# Patient Record
Sex: Male | Born: 1950 | Hispanic: Refuse to answer | Smoking: Never smoker
Health system: Southern US, Community
[De-identification: ages and names within clinical notes are randomized; demographics above are authoritative.]

## PROBLEM LIST (undated history)

## (undated) DIAGNOSIS — I2510 Atherosclerotic heart disease of native coronary artery without angina pectoris: Secondary | ICD-10-CM

## (undated) DIAGNOSIS — N200 Calculus of kidney: Secondary | ICD-10-CM

## (undated) DIAGNOSIS — E215 Disorder of parathyroid gland, unspecified: Secondary | ICD-10-CM

## (undated) DIAGNOSIS — R55 Syncope and collapse: Secondary | ICD-10-CM

## (undated) HISTORY — DX: Atherosclerotic heart disease of native coronary artery without angina pectoris: I25.10

## (undated) HISTORY — DX: Hypercalcemia: E83.52

## (undated) HISTORY — DX: Syncope and collapse: R55

## (undated) HISTORY — DX: Disorder of parathyroid gland, unspecified: E21.5

---

## 2002-06-22 ENCOUNTER — Emergency Department (HOSPITAL_COMMUNITY): Admission: EM | Admit: 2002-06-22 | Discharge: 2002-06-23 | Payer: Self-pay | Admitting: Emergency Medicine

## 2002-06-23 ENCOUNTER — Encounter: Payer: Self-pay | Admitting: Emergency Medicine

## 2002-12-03 ENCOUNTER — Ambulatory Visit (HOSPITAL_COMMUNITY): Admission: RE | Admit: 2002-12-03 | Discharge: 2002-12-03 | Payer: Self-pay | Admitting: *Deleted

## 2006-05-12 ENCOUNTER — Emergency Department (HOSPITAL_COMMUNITY): Admission: EM | Admit: 2006-05-12 | Discharge: 2006-05-12 | Payer: Self-pay | Admitting: *Deleted

## 2009-02-14 ENCOUNTER — Emergency Department (HOSPITAL_COMMUNITY): Admission: EM | Admit: 2009-02-14 | Discharge: 2009-02-14 | Payer: Self-pay | Admitting: Emergency Medicine

## 2010-07-13 LAB — URINALYSIS, ROUTINE W REFLEX MICROSCOPIC
Bilirubin Urine: NEGATIVE
Glucose, UA: NEGATIVE mg/dL
Ketones, ur: NEGATIVE mg/dL
Nitrite: NEGATIVE
Protein, ur: NEGATIVE mg/dL
Specific Gravity, Urine: 1.024 (ref 1.005–1.030)
Urobilinogen, UA: 1 mg/dL (ref 0.0–1.0)
pH: 6 (ref 5.0–8.0)

## 2010-07-13 LAB — URINE MICROSCOPIC-ADD ON

## 2015-05-08 ENCOUNTER — Emergency Department (HOSPITAL_COMMUNITY)
Admission: EM | Admit: 2015-05-08 | Discharge: 2015-05-08 | Disposition: A | Discharge status: Home / Self Care | Payer: 59 | Attending: Emergency Medicine | Admitting: Emergency Medicine

## 2015-05-08 ENCOUNTER — Encounter (HOSPITAL_COMMUNITY): Payer: Self-pay | Admitting: *Deleted

## 2015-05-08 ENCOUNTER — Emergency Department (HOSPITAL_COMMUNITY): Payer: 59

## 2015-05-08 DIAGNOSIS — N200 Calculus of kidney: Secondary | ICD-10-CM | POA: Insufficient documentation

## 2015-05-08 DIAGNOSIS — R109 Unspecified abdominal pain: Secondary | ICD-10-CM | POA: Diagnosis present

## 2015-05-08 HISTORY — DX: Calculus of kidney: N20.0

## 2015-05-08 LAB — BASIC METABOLIC PANEL
ANION GAP: 11 (ref 5–15)
BUN: 20 mg/dL (ref 6–20)
CHLORIDE: 103 mmol/L (ref 101–111)
CO2: 25 mmol/L (ref 22–32)
Calcium: 10 mg/dL (ref 8.9–10.3)
Creatinine, Ser: 1.25 mg/dL — ABNORMAL HIGH (ref 0.61–1.24)
GFR calc Af Amer: >60 mL/min (ref >60)
GLUCOSE: 151 mg/dL — AB (ref 65–99)
POTASSIUM: 4.5 mmol/L (ref 3.5–5.1)
SODIUM: 139 mmol/L (ref 135–145)

## 2015-05-08 LAB — CBC
HCT: 47.7 % (ref 39.0–52.0)
HEMOGLOBIN: 16 g/dL (ref 13.0–17.0)
MCH: 30.7 pg (ref 26.0–34.0)
MCHC: 33.5 g/dL (ref 30.0–36.0)
MCV: 91.4 fL (ref 78.0–100.0)
PLATELETS: 274 10*3/uL (ref 150–400)
RBC: 5.22 MIL/uL (ref 4.22–5.81)
RDW: 13.1 % (ref 11.5–15.5)
WBC: 14.4 10*3/uL — AB (ref 4.0–10.5)

## 2015-05-08 LAB — URINALYSIS, ROUTINE W REFLEX MICROSCOPIC
BILIRUBIN URINE: NEGATIVE
Glucose, UA: NEGATIVE mg/dL
Ketones, ur: 15 mg/dL — AB
LEUKOCYTES UA: NEGATIVE
NITRITE: NEGATIVE
Protein, ur: 100 mg/dL — AB
SPECIFIC GRAVITY, URINE: 1.022 (ref 1.005–1.030)
pH: 5 (ref 5.0–8.0)

## 2015-05-08 LAB — URINE MICROSCOPIC-ADD ON

## 2015-05-08 MED ORDER — FENTANYL CITRATE (PF) 100 MCG/2ML IJ SOLN
50.0000 ug | Freq: Once | INTRAMUSCULAR | Status: AC
  Administered 2015-05-08: 50 ug via INTRAVENOUS
  Filled 2015-05-08: qty 2

## 2015-05-08 MED ORDER — SODIUM CHLORIDE 0.9 % IV BOLUS (SEPSIS)
1000.0000 mL | Freq: Once | INTRAVENOUS | Status: AC
  Administered 2015-05-08: 1000 mL via INTRAVENOUS

## 2015-05-08 MED ORDER — KETOROLAC TROMETHAMINE 30 MG/ML IJ SOLN
30.0000 mg | Freq: Once | INTRAMUSCULAR | Status: AC
Start: 2015-05-08 — End: 2015-05-08
  Administered 2015-05-08: 30 mg via INTRAVENOUS
  Filled 2015-05-08: qty 1

## 2015-05-08 MED ORDER — ONDANSETRON 4 MG PO TBDP: 4.0000 mg | ORAL_TABLET | Freq: Three times a day (TID) | ORAL | Status: DC | PRN

## 2015-05-08 MED ORDER — OXYCODONE-ACETAMINOPHEN 5-325 MG PO TABS: 1.0000 | ORAL_TABLET | Freq: Four times a day (QID) | ORAL | Status: DC | PRN

## 2015-05-08 MED ORDER — TAMSULOSIN HCL 0.4 MG PO CAPS: 0.4000 mg | ORAL_CAPSULE | Freq: Every day | ORAL | Status: DC

## 2015-05-08 NOTE — ED Provider Notes (Signed)
CSN: [id number]     Arrival date & time 05/08/15  1037 History   First MD Initiated Contact with Patient 05/08/15 1058     Chief Complaint  Patient presents with  . Flank Pain   (Consider location/radiation/quality/duration/timing/severity/associated sxs/prior Treatment) HPI 65 y.o. male with a hx of kidney stones, presents to the Emergency Department today complaining of R flank pain since this morning. Has associated N/V with no hematemesis. Describes the pain as sharp 10/10 in right flank. Notes pain at rest and with movement. No fevers. No CP/SOB/ABD pain. Has not taken anything for relief. No other symptoms noted.    Past Medical History  Diagnosis Date  . Kidney stones    History reviewed. No pertinent past surgical history. No family history on file. Social History  Substance Use Topics  . Smoking status: Never Smoker   . Smokeless tobacco: None  . Alcohol Use: No    Review of Systems ROS reviewed and all are negative for acute change except as noted in the HPI.  Allergies  Review of patient's allergies indicates no known allergies.  Home Medications   Prior to Admission medications   Not on File   BP 160/83 mmHg  Pulse 49  Temp(Src) 97.6 F (36.4 C) (Oral)  Resp 17  Wt 73.483 kg  SpO2 100%   Physical Exam  Constitutional: He is oriented to person, place, and time. He appears well-developed and well-nourished.  HENT:  Head: Normocephalic and atraumatic.  Eyes: EOM are normal.  Neck: Normal range of motion.  Cardiovascular: Normal rate and regular rhythm.   Pulmonary/Chest: Effort normal.  Abdominal: Soft.  Musculoskeletal: Normal range of motion.       Cervical back: Normal.       Thoracic back: Normal.       Lumbar back: Normal.  R flank pain noted with positive CVA tenderness   Neurological: He is alert and oriented to person, place, and time.  Skin: Skin is warm and dry.  Psychiatric: He has a normal mood and affect. His behavior is normal. Thought  content normal.  Nursing note and vitals reviewed.   ED Course  Procedures (including critical care time) Labs Review Labs Reviewed  URINALYSIS, ROUTINE W REFLEX MICROSCOPIC (NOT AT [facility]) - Abnormal; Notable for the following:    Color, Urine AMBER (*)    APPearance CLOUDY (*)    Hgb urine dipstick LARGE (*)    Ketones, ur 15 (*)    Protein, ur 100 (*)    All other components within normal limits  CBC - Abnormal; Notable for the following:    WBC 14.4 (*)    All other components within normal limits  BASIC METABOLIC PANEL - Abnormal; Notable for the following:    Glucose, Bld 151 (*)    Creatinine, Ser 1.25 (*)    All other components within normal limits  URINE MICROSCOPIC-ADD ON - Abnormal; Notable for the following:    Squamous Epithelial / LPF 0-5 (*)    Bacteria, UA FEW (*)    All other components within normal limits    Imaging Review Ct Renal Stone Study  05/08/2015  CLINICAL DATA:  Right flank pain starting this morning with nausea vomiting. EXAM: CT ABDOMEN AND PELVIS WITHOUT CONTRAST TECHNIQUE: Multidetector CT imaging of the abdomen and pelvis was performed following the standard protocol without IV contrast. COMPARISON:  05/12/2006. FINDINGS: Lower chest: Small cluster of tree in bud opacity is identified in the lateral right lower lobe. Hepatobiliary: No  focal abnormality in the liver on this study without intravenous contrast. No evidence of hepatomegaly. There is no evidence for gallstones, gallbladder wall thickening, or pericholecystic fluid. No intrahepatic or extrahepatic biliary dilation. Pancreas: No focal mass lesion. No dilatation of the main duct. No intraparenchymal cyst. No peripancreatic edema. Spleen: No splenomegaly. No focal mass lesion. Adrenals/Urinary Tract: No adrenal nodule or mass. 1 mm nonobstructing stone is identified in the upper pole of the right kidney. There is mild right hydronephrosis. 5 x 9 x 6 mm stone is identified in the right renal  pelvis but does not appear obstructing. There is right-sided hydroureter down to the level of the right iliac vessels where a 3 x 4 x 5 mm ureteral stone is evident. 4-5 cm distal to this ureteral stone is another stone in the right ureter (see image 74 series 2) measuring about 3 x 3 x 4 mm. This stone is about 1 cm proximal to the UVJ. 7 x 5 mm stone is identified in the upper pole of the left kidney. Three additional stones are seen in the lower pole the left kidney ranging in size from 2 mm up to about 5 mm. Configuration in the central left kidney suggest the presence of central sinus cysts. No evidence for hydronephrosis or hydroureter on the left. No evidence for bladder stones. Stomach/Bowel: Stomach is nondistended. No gastric wall thickening. No evidence of outlet obstruction. Duodenum is normally positioned as is the ligament of Treitz. No small bowel wall thickening. No small bowel dilatation. The terminal ileum is normal. The appendix is normal. No gross colonic mass. No colonic wall thickening. No substantial diverticular change. Vascular/Lymphatic: There is abdominal aortic atherosclerosis without aneurysm. There is no gastrohepatic or hepatoduodenal ligament lymphadenopathy. No intraperitoneal or retroperitoneal lymphadenopathy. No pelvic sidewall lymphadenopathy. Reproductive: The prostate gland and seminal vesicles have normal imaging features. Other: No intraperitoneal free fluid. Musculoskeletal: Bone windows reveal no worrisome lytic or sclerotic osseous lesions. IMPRESSION: 1. Two stones are identified in the right ureter, 1 measuring 3 x 4 x 5 mm at the level of the iliac vessels and a second measuring 3 x 3 x 4 mm just proximal to the UVJ. The stones cause mild right hydroureteronephrosis. An additional stone on the right is identified in the renal pelvis. 2. Bilateral nephrolithiasis. 3. Probable central sinus cysts in the left kidney. 4. Small focus of tree-in-bud opacity in the right lower  lobe compatible with sequelae of atypical infection. Imaging findings of potential clinical significance: Aortic Atherosclerosis Electronically Signed   By: Kennith Center M.D.   On: 05/08/2015 11:41   I have personally reviewed and evaluated these images and lab results as part of my medical decision-making.   EKG Interpretation None      MDM  I have reviewed relevant laboratory values. I have reviewed relevant imaging studies. I have reviewed the relevant previous healthcare records. I obtained HPI from historian. Patient discussed with supervising physician  ED Course: CT Renal  Assessment: 64y M with hx kidney stones presents with R flank pain since this AM. Positive CVA tenderness. Afebrile. VSS. UA showed Hgb. Few bacteria, no leuk, Labs unremarkable. CT Renal showed two stone in right ureter. Mild hydronephrosis. Low suspicion for pyelonephritis. Will dc patient with analgesia, antiemetics, flomax. Follow up with PCP for further management.    Patient is in no acute distress. Vital Signs are stable. Patient is able to ambulate. Patient able to tolerate PO.   Disposition/Plan:  DC Home Additional Verbal  discharge instructions given and discussed with patient.  Pt Instructed to f/u with PCP in the next 48-72 hours for evaluation and treatment of symptoms. Return precautions given Pt acknowledges and agrees with plan   Supervising Physician Melene Plan, DO   Final diagnoses:  Right flank pain  Nephrolithiasis     Audry Pili, PA-C 05/08/15 1347  Melene Plan, DO 05/08/15 1350

## 2015-05-08 NOTE — ED Notes (Signed)
Pt reports R flank pain which started this am with n/v.  Pallor noted.  Denies any dysuria or hematuria at this time.

## 2015-05-08 NOTE — Discharge Instructions (Signed)
Please read and follow all provided instructions.  Your diagnoses today include:  1. Nephrolithiasis   2. Right flank pain     Tests performed today include:  Urine test that showed blood in your urine and no infection  CT scan which showed 2 kidney stones on the Right side  Blood test that showed normal kidney function  Vital signs. See below for your results today.   Medications prescribed:   Take any prescribed medications only as directed.  Home care instructions:  Follow any educational materials contained in this packet.  Please double your fluid intake for the next several days. Strain your urine and save any stones that may pass.   BE VERY CAREFUL not to take multiple medicines containing Tylenol (also called acetaminophen). Doing so can lead to an overdose which can damage your liver and cause liver failure and possibly death.   Follow-up instructions: Please follow-up with your urologist or the urologist referral (provided on front page) in the next 1 week for further evaluation of your symptoms.  If you need to return to the Emergency Department, go to [facility] and not [facility]. The urologists are located at [facility] and can better care for you at this location.  Return instructions:  If you need to return to the Emergency Department, go to [facility] and not [facility]. The urologists are located at [facility] and can better care for you at this location.   Please return to the Emergency Department if you experience worsening symptoms.  Please return if you develop fever or uncontrolled pain or vomiting.  Please return if you have any other emergent concerns.  Additional Information:  Your vital signs today were: BP 145/79 mmHg   Pulse 70   Temp(Src) 97.5 F (36.4 C) (Oral)   Resp 18   Wt 73.483 kg   SpO2 98% If your blood pressure (BP) was elevated above 135/85 this visit, please have this repeated by your  doctor within one month. --------------

## 2015-05-08 NOTE — ED Notes (Signed)
See triage note. She arrives with foley catheter (draining amber urine without difficulty) and she arrives with wound vac. On a decubitus on her bottom.

## 2015-06-11 ENCOUNTER — Encounter (HOSPITAL_COMMUNITY): Payer: Self-pay | Admitting: *Deleted

## 2015-06-11 ENCOUNTER — Emergency Department (HOSPITAL_COMMUNITY)
Admission: EM | Admit: 2015-06-11 | Discharge: 2015-06-11 | Disposition: A | Discharge status: Home / Self Care | Payer: 59 | Attending: Emergency Medicine | Admitting: Emergency Medicine

## 2015-06-11 DIAGNOSIS — N201 Calculus of ureter: Secondary | ICD-10-CM | POA: Insufficient documentation

## 2015-06-11 DIAGNOSIS — R109 Unspecified abdominal pain: Secondary | ICD-10-CM | POA: Diagnosis present

## 2015-06-11 DIAGNOSIS — Z79899 Other long term (current) drug therapy: Secondary | ICD-10-CM | POA: Insufficient documentation

## 2015-06-11 LAB — BASIC METABOLIC PANEL
Anion gap: 9 (ref 5–15)
BUN: 22 mg/dL — ABNORMAL HIGH (ref 6–20)
CHLORIDE: 106 mmol/L (ref 101–111)
CO2: 27 mmol/L (ref 22–32)
CREATININE: 1.33 mg/dL — AB (ref 0.61–1.24)
Calcium: 10.5 mg/dL — ABNORMAL HIGH (ref 8.9–10.3)
GFR calc non Af Amer: 56 mL/min — ABNORMAL LOW (ref >60)
GLUCOSE: 135 mg/dL — AB (ref 65–99)
Potassium: 5.2 mmol/L — ABNORMAL HIGH (ref 3.5–5.1)
Sodium: 142 mmol/L (ref 135–145)

## 2015-06-11 LAB — CBC WITH DIFFERENTIAL/PLATELET
Basophils Absolute: 0 10*3/uL (ref 0.0–0.1)
Basophils Relative: 0 %
EOS ABS: 0 10*3/uL (ref 0.0–0.7)
Eosinophils Relative: 0 %
HEMATOCRIT: 46.2 % (ref 39.0–52.0)
HEMOGLOBIN: 15.3 g/dL (ref 13.0–17.0)
LYMPHS ABS: 0.6 10*3/uL — AB (ref 0.7–4.0)
Lymphocytes Relative: 4 %
MCH: 30.4 pg (ref 26.0–34.0)
MCHC: 33.1 g/dL (ref 30.0–36.0)
MCV: 91.7 fL (ref 78.0–100.0)
MONO ABS: 0.6 10*3/uL (ref 0.1–1.0)
MONOS PCT: 4 %
NEUTROS ABS: 13.2 10*3/uL — AB (ref 1.7–7.7)
NEUTROS PCT: 92 %
Platelets: 271 10*3/uL (ref 150–400)
RBC: 5.04 MIL/uL (ref 4.22–5.81)
RDW: 13 % (ref 11.5–15.5)
WBC: 14.3 10*3/uL — ABNORMAL HIGH (ref 4.0–10.5)

## 2015-06-11 LAB — URINALYSIS, ROUTINE W REFLEX MICROSCOPIC
BILIRUBIN URINE: NEGATIVE
GLUCOSE, UA: NEGATIVE mg/dL
Ketones, ur: 15 mg/dL — AB
Leukocytes, UA: NEGATIVE
NITRITE: NEGATIVE
PH: 6.5 (ref 5.0–8.0)
Protein, ur: 30 mg/dL — AB
SPECIFIC GRAVITY, URINE: 1.024 (ref 1.005–1.030)

## 2015-06-11 LAB — URINE MICROSCOPIC-ADD ON

## 2015-06-11 MED ORDER — KETOROLAC TROMETHAMINE 30 MG/ML IJ SOLN
30.0000 mg | Freq: Once | INTRAMUSCULAR | Status: AC
  Administered 2015-06-11: 30 mg via INTRAVENOUS
  Filled 2015-06-11: qty 1

## 2015-06-11 MED ORDER — TAMSULOSIN HCL 0.4 MG PO CAPS: 0.4000 mg | ORAL_CAPSULE | Freq: Every day | ORAL | Status: DC

## 2015-06-11 MED ORDER — ONDANSETRON HCL 4 MG/2ML IJ SOLN
4.0000 mg | Freq: Once | INTRAMUSCULAR | Status: AC
  Administered 2015-06-11: 4 mg via INTRAVENOUS
  Filled 2015-06-11: qty 2

## 2015-06-11 MED ORDER — SODIUM CHLORIDE 0.9 % IV BOLUS (SEPSIS)
1000.0000 mL | Freq: Once | INTRAVENOUS | Status: AC
  Administered 2015-06-11: 1000 mL via INTRAVENOUS

## 2015-06-11 MED ORDER — ONDANSETRON 4 MG PO TBDP: 4.0000 mg | ORAL_TABLET | Freq: Three times a day (TID) | ORAL | Status: DC | PRN

## 2015-06-11 MED ORDER — OXYCODONE-ACETAMINOPHEN 5-325 MG PO TABS: 2.0000 | ORAL_TABLET | ORAL | Status: DC | PRN

## 2015-06-11 MED ORDER — HYDROMORPHONE HCL 1 MG/ML IJ SOLN
1.0000 mg | INTRAMUSCULAR | Status: DC | PRN
  Administered 2015-06-11: 1 mg via INTRAVENOUS
  Filled 2015-06-11: qty 1

## 2015-06-11 MED ORDER — TAMSULOSIN HCL 0.4 MG PO CAPS
0.4000 mg | ORAL_CAPSULE | Freq: Once | ORAL | Status: AC
  Administered 2015-06-11: 0.4 mg via ORAL
  Filled 2015-06-11: qty 1

## 2015-06-11 NOTE — Discharge Instructions (Signed)
Call Urologist for follow up appointment. Push fluids, stay hydrated.    Kidney Stones Kidney stones (urolithiasis) are deposits that form inside your kidneys. The intense pain is caused by the stone moving through the urinary tract. When the stone moves, the ureter goes into spasm around the stone. The stone is usually passed in the urine.  CAUSES   A disorder that makes certain neck glands produce too much parathyroid hormone (primary hyperparathyroidism).  A buildup of uric acid crystals, similar to gout in your joints.  Narrowing (stricture) of the ureter.  A kidney obstruction present at birth (congenital obstruction).  Previous surgery on the kidney or ureters.  Numerous kidney infections. SYMPTOMS   Feeling sick to your stomach (nauseous).  Throwing up (vomiting).  Blood in the urine (hematuria).  Pain that usually spreads (radiates) to the groin.  Frequency or urgency of urination. DIAGNOSIS   Taking a history and physical exam.  Blood or urine tests.  CT scan.  Occasionally, an examination of the inside of the urinary bladder (cystoscopy) is performed. TREATMENT   Observation.  Increasing your fluid intake.  Extracorporeal shock wave lithotripsy--This is a noninvasive procedure that uses shock waves to break up kidney stones.  Surgery may be needed if you have severe pain or persistent obstruction. There are various surgical procedures. Most of the procedures are performed with the use of small instruments. Only small incisions are needed to accommodate these instruments, so recovery time is minimized. The size, location, and chemical composition are all important variables that will determine the proper choice of action for you. Talk to your health care provider to better understand your situation so that you will minimize the risk of injury to yourself and your kidney.  HOME CARE INSTRUCTIONS   Drink enough water and fluids to keep your urine clear or  pale yellow. This will help you to pass the stone or stone fragments.  Strain all urine through the provided strainer. Keep all particulate matter and stones for your health care provider to see. The stone causing the pain may be as small as a grain of salt. It is very important to use the strainer each and every time you pass your urine. The collection of your stone will allow your health care provider to analyze it and verify that a stone has actually passed. The stone analysis will often identify what you can do to reduce the incidence of recurrences.  Only take over-the-counter or prescription medicines for pain, discomfort, or fever as directed by your health care provider.  Keep all follow-up visits as told by your health care provider. This is important.  Get follow-up X-rays if required. The absence of pain does not always mean that the stone has passed. It may have only stopped moving. If the urine remains completely obstructed, it can cause loss of kidney function or even complete destruction of the kidney. It is your responsibility to make sure X-rays and follow-ups are completed. Ultrasounds of the kidney can show blockages and the status of the kidney. Ultrasounds are not associated with any radiation and can be performed easily in a matter of minutes.  Make changes to your daily diet as told by your health care provider. You may be told to:  Limit the amount of salt that you eat.  Eat 5 or more servings of fruits and vegetables each day.  Limit the amount of meat, poultry, fish, and eggs that you eat.  Collect a 24-hour urine sample as told by  your health care provider.You may need to collect another urine sample every 6-12 months. SEEK MEDICAL CARE IF:  You experience pain that is progressive and unresponsive to any pain medicine you have been prescribed. SEEK IMMEDIATE MEDICAL CARE IF:   Pain cannot be controlled with the prescribed medicine.  You have a fever or shaking  chills.  The severity or intensity of pain increases over 18 hours and is not relieved by pain medicine.  You develop a new onset of abdominal pain.  You feel faint or pass out.  You are unable to urinate.   This information is not intended to replace advice given to you by your health care provider. Make sure you discuss any questions you have with your health care provider.   Document Released: 03/27/2005 Document Revised: 12/16/2014 Document Reviewed: 08/28/2012 Elsevier Interactive Patient Education [employer]2016 [employer]vier Inc.

## 2015-06-11 NOTE — ED Notes (Signed)
Pt reports R flank pain since yesterday. Has been seen here a month ago for same.  Has hx of kidney stones.  Denies any n/v at this time.

## 2015-06-11 NOTE — ED Provider Notes (Signed)
CSN: [id number]     Arrival date & time 06/11/15  1323 History   First MD Initiated Contact with Patient 06/11/15 1340     Chief Complaint  Patient presents with  . Flank Pain      HPI  She presents today with right flank pain since yesterday. History of kidney stones. Last ER visit and imaging was no 28th of this year. He had a 4 in 5 mm ureteral stones. Had a UPJ stone that was 9 x 5 x 4 that was not causing obstruction. Onset of symptoms started yesterday when suddenly. Worsening today. I think the previous pelvic stone is likely his culprit today  Past Medical History  Diagnosis Date  . Kidney stones    History reviewed. No pertinent past surgical history. History reviewed. No pertinent family history. Social History  Substance Use Topics  . Smoking status: Never Smoker   . Smokeless tobacco: None  . Alcohol Use: No    Review of Systems  Constitutional: Negative for fever, chills, diaphoresis, appetite change and fatigue.  HENT: Negative for mouth sores, sore throat and trouble swallowing.   Eyes: Negative for visual disturbance.  Respiratory: Negative for cough, chest tightness, shortness of breath and wheezing.   Cardiovascular: Negative for chest pain.  Gastrointestinal: Negative for nausea, vomiting, abdominal pain, diarrhea and abdominal distention.  Endocrine: Negative for polydipsia, polyphagia and polyuria.  Genitourinary: Positive for flank pain. Negative for dysuria, frequency and hematuria.  Musculoskeletal: Negative for gait problem.  Skin: Negative for color change, pallor and rash.  Neurological: Negative for dizziness, syncope, light-headedness and headaches.  Hematological: Does not bruise/bleed easily.  Psychiatric/Behavioral: Negative for behavioral problems and confusion.      Allergies  Review of patient's allergies indicates no known allergies.  Home Medications   Prior to Admission medications   Medication Sig Start Date End Date Taking?  Authorizing Provider  Multiple Vitamins-Minerals (MULTIVITAMIN & MINERAL PO) Take 1 tablet by mouth daily.   Yes Historical Provider, MD  ondansetron (ZOFRAN ODT) 4 MG disintegrating tablet Take 1 tablet (4 mg total) by mouth every 8 (eight) hours as needed for nausea. 06/11/15   Rolland PorterMark Taiylor Virden, MD  oxyCODONE-acetaminophen (PERCOCET/ROXICET) 5-325 MG tablet Take 2 tablets by mouth every 4 (four) hours as needed. 06/11/15   Rolland PorterMark Carlene Bickley, MD  tamsulosin (FLOMAX) 0.4 MG CAPS capsule Take 1 capsule (0.4 mg total) by mouth daily. 06/11/15   Rolland PorterMark Jandel Patriarca, MD   BP 162/93 mmHg  Pulse 54  Temp(Src) 98.2 F (36.8 C) (Oral)  Resp 16  SpO2 100% Physical Exam  Constitutional: He is oriented to person, place, and time. He appears well-developed and well-nourished. No distress.  HENT:  Head: Normocephalic.  Eyes: Conjunctivae are normal. Pupils are equal, round, and reactive to light. No scleral icterus.  Neck: Normal range of motion. Neck supple. No thyromegaly present.  Cardiovascular: Normal rate and regular rhythm.  Exam reveals no gallop and no friction rub.   No murmur heard. Pulmonary/Chest: Effort normal and breath sounds normal. No respiratory distress. He has no wheezes. He has no rales.  Abdominal: Soft. Bowel sounds are normal. He exhibits no distension. There is no tenderness. There is no rebound.  Musculoskeletal: Normal range of motion.  Neurological: He is alert and oriented to person, place, and time.  Skin: Skin is warm and dry. No rash noted.  Psychiatric: He has a normal mood and affect. His behavior is normal.    ED Course  Procedures (including critical care time)  Labs Review Labs Reviewed  BASIC METABOLIC PANEL - Abnormal; Notable for the following:    Potassium 5.2 (*)    Glucose, Bld 135 (*)    BUN 22 (*)    Creatinine, Ser 1.33 (*)    Calcium 10.5 (*)    GFR calc non Af Amer 56 (*)    All other components within normal limits  CBC WITH DIFFERENTIAL/PLATELET - Abnormal; Notable  for the following:    WBC 14.3 (*)    Neutro Abs 13.2 (*)    Lymphs Abs 0.6 (*)    All other components within normal limits  URINALYSIS, ROUTINE W REFLEX MICROSCOPIC (NOT AT [facility])    Imaging Review No results found. I have personally reviewed and evaluated these images and lab results as part of my medical decision-making.   EKG Interpretation None      MDM   Final diagnoses:  Ureteral stone    Patient's symptoms are well-controlled after initial dosing of medication. Although a 9 mm stone noted on previous CT, its AP and lateral dimensions are 4 and 5. Consistent with potential passage. Dr. Arelia Sneddon, of urology was in agreement. He felt the patient should follow-up with them nonetheless to ensure that the patient wasn't having silent obstruction. I discussed this at length with patient. I await analysis which shows no sign of infection.    Rolland Porter, MD 06/11/15 1536

## 2015-06-11 NOTE — ED Notes (Signed)
0800 rt flank pain, history of kidney, able to urinate

## 2015-08-26 ENCOUNTER — Other Ambulatory Visit (HOSPITAL_COMMUNITY): Payer: Self-pay | Admitting: Surgery

## 2015-08-26 DIAGNOSIS — E210 Primary hyperparathyroidism: Secondary | ICD-10-CM

## 2015-09-07 ENCOUNTER — Encounter (HOSPITAL_COMMUNITY)
Admission: RE | Admit: 2015-09-07 | Discharge: 2015-09-07 | Disposition: A | Discharge status: Home / Self Care | Payer: 59 | Source: Ambulatory Visit | Attending: Surgery | Admitting: Surgery

## 2015-09-07 DIAGNOSIS — E210 Primary hyperparathyroidism: Secondary | ICD-10-CM | POA: Diagnosis not present

## 2015-09-07 MED ORDER — TECHNETIUM TC 99M SESTAMIBI - CARDIOLITE
27.5000 | Freq: Once | INTRAVENOUS | Status: AC | PRN
  Administered 2015-09-07: 10:00:00 28 via INTRAVENOUS

## 2016-01-11 ENCOUNTER — Other Ambulatory Visit: Payer: Self-pay

## 2016-01-11 ENCOUNTER — Encounter: Payer: Self-pay | Admitting: Endocrinology

## 2016-01-11 ENCOUNTER — Ambulatory Visit (INDEPENDENT_AMBULATORY_CARE_PROVIDER_SITE_OTHER): Payer: 59 | Admitting: Endocrinology

## 2016-01-11 ENCOUNTER — Other Ambulatory Visit: Payer: Self-pay | Admitting: Endocrinology

## 2016-01-11 NOTE — Progress Notes (Signed)
   Subjective:    Patient ID: John Bryant, male    DOB: [birth year], 65 y.o.   MRN: [id number]  HPI Pt was noted to have moderate hypercalcemia in 2017.  He has had urolithiasis since approx 2010, most recently at the right kidney.  No assoc hematuria.  He has never had osteoporosis, thyroid probs, sarcoidosis, cancer, PUD, pancreatitis, depression, or bony fracture.  He does not take A supplement. Pt denies taking antacids, Li++, or HCTZ.  He has been on non-rx vitamin-D for 1 month, 2000 U/d.  He was advised to have surgery, but he wants to discuss further.   Past Medical History:  Diagnosis Date  . Kidney stones     No past surgical history on file.  Social History   Social History  . Marital status: Divorced    Spouse name: N/A  . Number of children: N/A  . Years of education: N/A   Occupational History  . Not on file.   Social History Main Topics  . Smoking status: Never Smoker  . Smokeless tobacco: Not on file  . Alcohol use No  . Drug use: No  . Sexual activity: Not on file   Other Topics Concern  . Not on file   Social History Narrative  . No narrative on file    No current outpatient prescriptions on file prior to visit.   No current facility-administered medications on file prior to visit.     No Known Allergies  Family History  Problem Relation Age of Onset  . Hyperparathyroidism Neg Hx     BP (!) 150/84   Pulse 68   Wt 164 lb (74.4 kg)   SpO2 97%   Review of Systems denies weight loss, gynecomastia, memory loss, erectile dysfunction, numbness, excessive arthralgias, abdominal pain, urinary frequency, hypoglycemia, skin rash, visual loss, sob, diarrhea, rhinorrhea, easy bruising, and depression.      Objective:   Physical Exam VS: see vs page GEN: no distress HEAD: head: no deformity eyes: no periorbital swelling, no proptosis external nose and ears are normal mouth: no lesion seen NECK: supple, thyroid is not enlarged CHEST WALL: no  deformity.  No kyphosis LUNGS: clear to auscultation CV: reg rate and rhythm, no murmur ABD: abdomen is soft, nontender.  no hepatosplenomegaly.  not distended.  no hernia MUSCULOSKELETAL: muscle bulk and strength are grossly normal.  no obvious joint swelling.  gait is normal and steady EXTEMITIES: no deformity.  no edema PULSES: no carotid bruit NEURO:  cn 2-12 grossly intact.   readily moves all 4's.  sensation is intact to touch on all 4's SKIN:  Normal texture and temperature.  No rash or suspicious lesion is visible.   NODES:  None palpable at the neck PSYCH: alert, well-oriented.  Does not appear anxious nor depressed.  I have reviewed outside records, and summarized: Pt was seen at [facility], and was advised to have parathyroid surgery.    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    Assessment & Plan:  Primary hyperparathyroidism, new to me. Patient is advised the following: Patient Instructions  blood tests are requested for you today.  We'll let you know about the results. If the parathyroid and calcium are high, you should consider having the surgery.

## 2016-01-11 NOTE — Patient Instructions (Signed)
blood tests are requested for you today.  We'll let you know about the results. If the parathyroid and calcium are high, you should consider having the surgery.

## 2016-01-13 LAB — PLEASE NOTE

## 2016-01-13 LAB — VITAMIN A: VITAMIN A: 77 ug/dL (ref 24–85)

## 2016-01-15 LAB — PROTEIN ELECTROPHORESIS, SERUM
A/G RATIO SPE: 1.6 (ref 0.7–1.7)
ALPHA 2: 0.6 g/dL (ref 0.4–1.0)
Albumin ELP: 3.9 g/dL (ref 2.9–4.4)
Alpha 1: 0.1 g/dL (ref 0.0–0.4)
BETA: 1 g/dL (ref 0.7–1.3)
GAMMA GLOBULIN: 0.7 g/dL (ref 0.4–1.8)
GLOBULIN, TOTAL: 2.4 g/dL (ref 2.2–3.9)
Total Protein: 6.3 g/dL (ref 6.0–8.5)

## 2016-01-15 LAB — PTH-RELATED PEPTIDE: PTH-related peptide: <1.1 pmol/L

## 2016-01-15 LAB — MAGNESIUM: Magnesium: 2.2 mg/dL (ref 1.6–2.3)

## 2016-01-15 LAB — PTH, INTACT AND CALCIUM
Calcium: 9.8 mg/dL (ref 8.6–10.2)
PTH: 46 pg/mL (ref 15–65)

## 2016-01-15 LAB — VITAMIN D 25 HYDROXY (VIT D DEFICIENCY, FRACTURES): VIT D 25 HYDROXY: 24.1 ng/mL — AB (ref 30.0–100.0)

## 2016-01-15 LAB — PROLACTIN: Prolactin: 4.6 ng/mL (ref 4.0–15.2)

## 2016-01-15 LAB — PHOSPHORUS: Phosphorus: 2.2 mg/dL — ABNORMAL LOW (ref 2.5–4.5)

## 2016-01-15 LAB — VITAMIN D 1,25 DIHYDROXY
VITAMIN D3 1, 25 (OH): 58 pg/mL
Vitamin D 1, 25 (OH)2 Total: 58 pg/mL

## 2016-01-17 ENCOUNTER — Telehealth: Payer: Self-pay

## 2016-01-17 NOTE — Telephone Encounter (Signed)
Called and left message advising of lab results, and to begin taking over the counter vitamin d. Advised he needed a follow up in 2 months with Dr.Ellison, gave call back number to make that appointment.

## 2016-08-03 ENCOUNTER — Ambulatory Visit (INDEPENDENT_AMBULATORY_CARE_PROVIDER_SITE_OTHER): Payer: 59 | Admitting: Endocrinology

## 2016-08-03 ENCOUNTER — Encounter: Payer: Self-pay | Admitting: Endocrinology

## 2016-08-03 NOTE — Progress Notes (Signed)
Subjective:    Patient ID: John Bryant, male    DOB: 1951/02/11, 66 y.o.   MRN: [id number]  HPI Pt returns for f/u of hypercalcemia and vit-D deficiency (dx'ed 2017; he has had urolithiasis since approx 2010, most recently at the right kidney; he has never had osteoporosis, thyroid probs, sarcoidosis, cancer, PUD, pancreatitis, depression, or bony fracture).  He takes 6000 units/day.  No recent episodes of urolithiasis.  pt states he feels well in general.  Past Medical History:  Diagnosis Date  . Kidney stones     No past surgical history on file.  Social History   Social History  . Marital status: Divorced    Spouse name: N/A  . Number of children: N/A  . Years of education: N/A   Occupational History  . Not on file.   Social History Main Topics  . Smoking status: Never Smoker  . Smokeless tobacco: Never Used  . Alcohol use No  . Drug use: No  . Sexual activity: Not on file   Other Topics Concern  . Not on file   Social History Narrative  . No narrative on file    Current Outpatient Prescriptions on File Prior to Visit  Medication Sig Dispense Refill  . Cholecalciferol (VITAMIN D) 2000 units CAPS Take 6,000 Units by mouth daily.     . Cyanocobalamin (VITAMIN B 12 PO) Take by mouth.     No current facility-administered medications on file prior to visit.     No Known Allergies  Family History  Problem Relation Age of Onset  . Hyperparathyroidism Neg Hx     BP (!) 152/100   Pulse 77   Ht 5' 10.5" (1.791 m)   Wt 160 lb (72.6 kg)   SpO2 97%   BMI 22.63 kg/m   Review of Systems Denies headache    Objective:   Physical Exam VITAL SIGNS:  See vs page GENERAL: no distress NECK: There is no palpable thyroid enlargement.  No thyroid nodule is palpable.  No palpable lymphadenopathy at the anterior neck. Ext: no edema.     Assessment & Plan:  Vit-D deficiency: on rx Hyperparathyroidism: due for recheck. HTN: persistent.  Patient Instructions    blood tests are requested for you today, to be done at [facility].  We'll let you know about the results.  Please come back for a follow-up appointment in 6 months.   Your blood pressure is high again today.  Please see Dr Kateri Plummer soon, for this.               Dietary Guidelines to Help Prevent Kidney Stones Kidney stones are deposits of minerals and salts that form inside your kidneys. Your risk of developing kidney stones may be greater depending on your diet, your lifestyle, the medicines you take, and whether you have certain medical conditions. Most people can reduce their chances of developing kidney stones by following the instructions below. Depending on your overall health and the type of kidney stones you tend to develop, your dietitian may give you more specific instructions. What are tips for following this plan? Reading food labels   Choose foods with "no salt added" or "low-salt" labels. Limit your sodium intake to less than 1500 mg per day.  Choose foods with calcium for each meal and snack. Try to eat about 300 mg of calcium at each meal. Foods that contain 200-500 mg of calcium per serving include:  8 oz (237 ml) of milk, fortified nondairy milk, and  fortified fruit juice.  8 oz (237 ml) of kefir, yogurt, and soy yogurt.  4 oz (118 ml) of tofu.  1 oz of cheese.  1 cup (300 g) of dried figs.  1 cup (91 g) of cooked broccoli.  1-3 oz can of sardines or mackerel.  Most people need 1000 to 1500 mg of calcium each day. Talk to your dietitian about how much calcium is recommended for you. Shopping   Buy plenty of fresh fruits and vegetables. Most people do not need to avoid fruits and vegetables, even if they contain nutrients that may contribute to kidney stones.  When shopping for convenience foods, choose:  Whole pieces of fruit.  Premade salads with dressing on the side.  Low-fat fruit and yogurt smoothies.  Avoid buying frozen meals or prepared deli  foods.  Look for foods with live cultures, such as yogurt and kefir. Cooking   Do not add salt to food when cooking. Place a salt shaker on the table and allow each person to add his or her own salt to taste.  Use vegetable protein, such as beans, textured vegetable protein (TVP), or tofu instead of meat in pasta, casseroles, and soups. Meal planning   Eat less salt, if told by your dietitian. To do this:  Avoid eating processed or premade food.  Avoid eating fast food.  Eat less animal protein, including cheese, meat, poultry, or fish, if told by your dietitian. To do this:  Limit the number of times you have meat, poultry, fish, or cheese each week. Eat a diet free of meat at least 2 days a week.  Eat only one serving each day of meat, poultry, fish, or seafood.  When you prepare animal protein, cut pieces into small portion sizes. For most meat and fish, one serving is about the size of one deck of cards.  Eat at least 5 servings of fresh fruits and vegetables each day. To do this:  Keep fruits and vegetables on hand for snacks.  Eat 1 piece of fruit or a handful of berries with breakfast.  Have a salad and fruit at lunch.  Have two kinds of vegetables at dinner.  Limit foods that are high in a substance called oxalate. These include:  Spinach.  Rhubarb.  Beets.  Potato chips and french fries.  Nuts.  If you regularly take a diuretic medicine, make sure to eat at least 1-2 fruits or vegetables high in potassium each day. These include:  Avocado.  Banana.  Orange, prune, carrot, or tomato juice.  Baked potato.  Cabbage.  Beans and split peas. General instructions   Drink enough fluid to keep your urine clear or pale yellow. This is the most important thing you can do.  Talk to your health care provider and dietitian about taking daily supplements. Depending on your health and the cause of your kidney stones, you may be advised:  Not to take  supplements with vitamin C.  To take a calcium supplement.  To take a daily probiotic supplement.  To take other supplements such as magnesium, fish oil, or vitamin B6.  Take all medicines and supplements as told by your health care provider.  Limit alcohol intake to no more than 1 drink a day for nonpregnant women and 2 drinks a day for men. One drink equals 12 oz of beer, 5 oz of wine, or 1 oz of hard liquor.  Lose weight if told by your health care provider. Work with your dietitian to  find strategies and an eating plan that works best for you. What foods are not recommended? Limit your intake of the following foods, or as told by your dietitian. Talk to your dietitian about specific foods you should avoid based on the type of kidney stones and your overall health. Grains  Breads. Bagels. Rolls. Baked goods. Salted crackers. Cereal. Pasta. Vegetables  Spinach. Rhubarb. Beets. Canned vegetables. Rosita Fire. Olives. Meats and other protein foods  Nuts. Nut butters. Large portions of meat, poultry, or fish. Salted or cured meats. Deli meats. Hot dogs. Sausages. Dairy  Cheese. Beverages  Regular soft drinks. Regular vegetable juice. Seasonings and other foods  Seasoning blends with salt. Salad dressings. Canned soups. Soy sauce. Ketchup. Barbecue sauce. Canned pasta sauce. Casseroles. Pizza. Lasagna. Frozen meals. Potato chips. [place] fries. Summary  You can reduce your risk of kidney stones by making changes to your diet.  The most important thing you can do is drink enough fluid. You should drink enough fluid to keep your urine clear or pale yellow.  Ask your health care provider or dietitian how much protein from animal sources you should eat each day, and also how much salt and calcium you should have each day. This information is not intended to replace advice given to you by your health care provider. Make sure you discuss any questions you have with your health care  provider. Document Released: 07/22/2010 Document Revised: 03/07/2016 Document Reviewed: 03/07/2016 Elsevier Interactive Patient Education  2017 [employer].

## 2016-08-03 NOTE — Patient Instructions (Addendum)
blood tests are requested for you today, to be done at [facility].  We'll let you know about the results.  Please come back for a follow-up appointment in 6 months.   Your blood pressure is high again today.  Please see Dr Kateri Plummer soon, for this.               Dietary Guidelines to Help Prevent Kidney Stones Kidney stones are deposits of minerals and salts that form inside your kidneys. Your risk of developing kidney stones may be greater depending on your diet, your lifestyle, the medicines you take, and whether you have certain medical conditions. Most people can reduce their chances of developing kidney stones by following the instructions below. Depending on your overall health and the type of kidney stones you tend to develop, your dietitian may give you more specific instructions. What are tips for following this plan? Reading food labels   Choose foods with "no salt added" or "low-salt" labels. Limit your sodium intake to less than 1500 mg per day.  Choose foods with calcium for each meal and snack. Try to eat about 300 mg of calcium at each meal. Foods that contain 200-500 mg of calcium per serving include:  8 oz (237 ml) of milk, fortified nondairy milk, and fortified fruit juice.  8 oz (237 ml) of kefir, yogurt, and soy yogurt.  4 oz (118 ml) of tofu.  1 oz of cheese.  1 cup (300 g) of dried figs.  1 cup (91 g) of cooked broccoli.  1-3 oz can of sardines or mackerel.  Most people need 1000 to 1500 mg of calcium each day. Talk to your dietitian about how much calcium is recommended for you. Shopping   Buy plenty of fresh fruits and vegetables. Most people do not need to avoid fruits and vegetables, even if they contain nutrients that may contribute to kidney stones.  When shopping for convenience foods, choose:  Whole pieces of fruit.  Premade salads with dressing on the side.  Low-fat fruit and yogurt smoothies.  Avoid buying frozen meals or prepared deli  foods.  Look for foods with live cultures, such as yogurt and kefir. Cooking   Do not add salt to food when cooking. Place a salt shaker on the table and allow each person to add his or her own salt to taste.  Use vegetable protein, such as beans, textured vegetable protein (TVP), or tofu instead of meat in pasta, casseroles, and soups. Meal planning   Eat less salt, if told by your dietitian. To do this:  Avoid eating processed or premade food.  Avoid eating fast food.  Eat less animal protein, including cheese, meat, poultry, or fish, if told by your dietitian. To do this:  Limit the number of times you have meat, poultry, fish, or cheese each week. Eat a diet free of meat at least 2 days a week.  Eat only one serving each day of meat, poultry, fish, or seafood.  When you prepare animal protein, cut pieces into small portion sizes. For most meat and fish, one serving is about the size of one deck of cards.  Eat at least 5 servings of fresh fruits and vegetables each day. To do this:  Keep fruits and vegetables on hand for snacks.  Eat 1 piece of fruit or a handful of berries with breakfast.  Have a salad and fruit at lunch.  Have two kinds of vegetables at dinner.  Limit foods that are high in  a substance called oxalate. These include:  Spinach.  Rhubarb.  Beets.  Potato chips and french fries.  Nuts.  If you regularly take a diuretic medicine, make sure to eat at least 1-2 fruits or vegetables high in potassium each day. These include:  Avocado.  Banana.  Orange, prune, carrot, or tomato juice.  Baked potato.  Cabbage.  Beans and split peas. General instructions   Drink enough fluid to keep your urine clear or pale yellow. This is the most important thing you can do.  Talk to your health care provider and dietitian about taking daily supplements. Depending on your health and the cause of your kidney stones, you may be advised:  Not to take  supplements with vitamin C.  To take a calcium supplement.  To take a daily probiotic supplement.  To take other supplements such as magnesium, fish oil, or vitamin B6.  Take all medicines and supplements as told by your health care provider.  Limit alcohol intake to no more than 1 drink a day for nonpregnant women and 2 drinks a day for men. One drink equals 12 oz of beer, 5 oz of wine, or 1 oz of hard liquor.  Lose weight if told by your health care provider. Work with your dietitian to find strategies and an eating plan that works best for you. What foods are not recommended? Limit your intake of the following foods, or as told by your dietitian. Talk to your dietitian about specific foods you should avoid based on the type of kidney stones and your overall health. Grains  Breads. Bagels. Rolls. Baked goods. Salted crackers. Cereal. Pasta. Vegetables  Spinach. Rhubarb. Beets. Canned vegetables. Rosita Fire. Olives. Meats and other protein foods  Nuts. Nut butters. Large portions of meat, poultry, or fish. Salted or cured meats. Deli meats. Hot dogs. Sausages. Dairy  Cheese. Beverages  Regular soft drinks. Regular vegetable juice. Seasonings and other foods  Seasoning blends with salt. Salad dressings. Canned soups. Soy sauce. Ketchup. Barbecue sauce. Canned pasta sauce. Casseroles. Pizza. Lasagna. Frozen meals. Potato chips. [place] fries. Summary  You can reduce your risk of kidney stones by making changes to your diet.  The most important thing you can do is drink enough fluid. You should drink enough fluid to keep your urine clear or pale yellow.  Ask your health care provider or dietitian how much protein from animal sources you should eat each day, and also how much salt and calcium you should have each day. This information is not intended to replace advice given to you by your health care provider. Make sure you discuss any questions you have with your health care  provider. Document Released: 07/22/2010 Document Revised: 03/07/2016 Document Reviewed: 03/07/2016 Elsevier Interactive Patient Education  2017 [employer].

## 2016-08-04 ENCOUNTER — Other Ambulatory Visit: Payer: Self-pay | Admitting: Endocrinology

## 2016-08-05 LAB — PTH, INTACT AND CALCIUM
Calcium: 10.8 mg/dL — ABNORMAL HIGH (ref 8.6–10.2)
PTH: 63 pg/mL (ref 15–65)

## 2016-08-05 LAB — VITAMIN D 25 HYDROXY (VIT D DEFICIENCY, FRACTURES): Vit D, 25-Hydroxy: 27.5 ng/mL — ABNORMAL LOW (ref 30.0–100.0)

## 2017-02-02 ENCOUNTER — Encounter: Payer: Self-pay | Admitting: Endocrinology

## 2017-02-02 ENCOUNTER — Ambulatory Visit (INDEPENDENT_AMBULATORY_CARE_PROVIDER_SITE_OTHER): Payer: 59 | Admitting: Endocrinology

## 2017-02-02 NOTE — Progress Notes (Signed)
   Subjective:    Patient ID: John Bryant, male    DOB: Nov 26, 1950, 66 y.o.   MRN: [id number]  HPI  Pt returns for f/u of mild hypercalcemia (dx'ed 2017; PTH was borderline high; he has had urolithiasis since approx 2010; w/u has been neg) vit-D deficiency (dx'ed 2017; he takes 7000 units/day).  No recent episodes of urolithiasis.  pt states he feels well in general, except for knee pain.   Past Medical History:  Diagnosis Date  . Kidney stones     No past surgical history on file.  Social History   Social History  . Marital status: Divorced    Spouse name: N/A  . Number of children: N/A  . Years of education: N/A   Occupational History  . Not on file.   Social History Main Topics  . Smoking status: Never Smoker  . Smokeless tobacco: Never Used  . Alcohol use No  . Drug use: No  . Sexual activity: Not on file   Other Topics Concern  . Not on file   Social History Narrative  . No narrative on file    Current Outpatient Prescriptions on File Prior to Visit  Medication Sig Dispense Refill  . Cholecalciferol (VITAMIN D) 2000 units CAPS Take 6,000 Units by mouth daily.      No current facility-administered medications on file prior to visit.     No Known Allergies  Family History  Problem Relation Age of Onset  . Hyperparathyroidism Neg Hx     BP 126/76   Pulse 84   Wt 157 lb 9.6 oz (71.5 kg)   SpO2 97%   BMI 22.29 kg/m   Review of Systems Denies cramps and numbness.      Objective:   Physical Exam VITAL SIGNS:  See vs page. GENERAL: no distress.  Chest wall: no kyphosis.   Lab Results  Component Value Date   PTH 63 08/04/2016   PTH Comment 08/04/2016   CALCIUM 10.8 (H) 08/04/2016   PHOS 2.2 (L) 01/11/2016      Assessment & Plan:  Hypercalcemia: due for recheck. Vit-D deficiency: due for recheck.   Patient Instructions  Blood and urine tests are requested for you today, to be done at [facility].  We'll let you know about the results.    Also, let's check a bone density test.  you will receive a phone call, about a day and time for an appointment Please come back for a follow-up appointment in 6 months.

## 2017-02-02 NOTE — Patient Instructions (Addendum)
Blood and urine tests are requested for you today, to be done at [facility].  We'll let you know about the results.  Also, let's check a bone density test.  you will receive a phone call, about a day and time for an appointment Please come back for a follow-up appointment in 6 months.

## 2017-02-26 ENCOUNTER — Telehealth: Payer: Self-pay | Admitting: Endocrinology

## 2017-02-26 NOTE — Telephone Encounter (Signed)
I see you normally order patient three different labs. He wants to go to [facility] today, so do you want me to put them in? Please advise.

## 2017-02-26 NOTE — Telephone Encounter (Signed)
Labs are in for [facility]

## 2017-02-26 NOTE — Telephone Encounter (Signed)
I called patient & he is calling me back with fax number to [facility].

## 2017-02-26 NOTE — Telephone Encounter (Addendum)
Patient ask you to give him a call when you sent the labs there.

## 2017-02-26 NOTE — Telephone Encounter (Signed)
Patient  Stated [facility] haven't received lab orders, so he can get his labs drawn. Please send them over today. Please advise

## 2017-02-26 NOTE — Telephone Encounter (Signed)
Fax for [facility] [phone] 

## 2017-02-26 NOTE — Telephone Encounter (Signed)
Done

## 2017-02-27 ENCOUNTER — Other Ambulatory Visit: Payer: Self-pay | Admitting: Endocrinology

## 2017-02-28 LAB — PTH, INTACT AND CALCIUM
CALCIUM: 10.1 mg/dL (ref 8.6–10.2)
PTH: 62 pg/mL (ref 15–65)

## 2017-02-28 LAB — VITAMIN D 25 HYDROXY (VIT D DEFICIENCY, FRACTURES): VIT D 25 HYDROXY: 26.9 ng/mL — AB (ref 30.0–100.0)

## 2017-03-05 ENCOUNTER — Other Ambulatory Visit: Payer: Self-pay

## 2017-03-06 ENCOUNTER — Other Ambulatory Visit: Payer: Self-pay | Admitting: Endocrinology

## 2017-03-07 LAB — CALCIUM, URINE, 24 HOUR
CALCIUM 24H UR: 324 mg/(24.h) — AB (ref 100.0–300.0)
Calcium, Urine: 18 mg/dL

## 2017-03-20 ENCOUNTER — Other Ambulatory Visit: Payer: 59

## 2017-05-02 NOTE — Progress Notes (Signed)
Cardiology Office Note   Date:  05/04/2017   ID:  John Bryant, DOB 22-May-1950, MRN [id number]  PCP:  [name], MD  Cardiologist:   Charlton Haws, MD   No chief complaint on file.     History of Present Illness: John Bryant is a 67 y.o. male who presents for consultation regarding chest pain Referred by Dr Kateri Plummer History of hypercalcemia diagnosed in 2017 PTH borderline history of kidney stones Seen by Dr Barnie Mort He is a non smoker Family history of CAD in mother He has had indigestion and chest pressure for a few months can happen at rest or going up stairs. 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 Hypercalcemia   . Kidney stones   . Parathyroid abnormality (HCC)     History reviewed. No pertinent surgical history.   Current Outpatient Medications  Medication Sig Dispense Refill  . calcium carbonate (TUMS - DOSED IN MG ELEMENTAL CALCIUM) 500 MG chewable tablet Chew 1 tablet by mouth daily.    . Cholecalciferol (VITAMIN D) 2000 units CAPS Take 6,000 Units by mouth daily.      No current facility-administered medications for this visit.     Allergies:   No known allergies    Social History:  The patient  reports that  has never smoked. he has never used smokeless tobacco. He reports that he drinks alcohol. He reports that he does not use drugs.   Family History:  The patient's family history includes Dementia in his mother; Lung cancer in his father.    ROS:  Please see the history of present illness.   Otherwise, review of systems are positive for none.   All other systems are reviewed and negative.    PHYSICAL EXAM: VS:  BP 140/84   Pulse 76   Ht 5\' 10"  (1.778 m)   Wt 161 lb 8 oz (73.3 kg)   SpO2 98%   BMI 23.17 kg/m  , BMI Body mass index is 23.17 kg/m. Affect appropriate Healthy:  appears stated age HEENT:  normal Neck supple with no adenopathy JVP normal no bruits no thyromegaly Lungs clear with no wheezing and good diaphragmatic motion Heart:  S1/S2 no murmur, no rub, gallop or click PMI normal Abdomen: benighn, BS positve, no tenderness, no AAA no bruit.  No HSM or HJR Distal pulses intact with no bruits No edema Neuro non-focal Skin warm and dry No muscular weakness    EKG:  NSR normal ECG    Recent Labs: No results found for requested labs within last 8760 hours.    Lipid Panel No results found for: CHOL, TRIG, HDL, CHOLHDL, VLDL, LDLCALC, LDLDIRECT    Wt Readings from Last 3 Encounters:  05/04/17 161 lb 8 oz (73.3 kg)  02/02/17 157 lb 9.6 oz (71.5 kg)  08/03/16 160 lb (72.6 kg)      Other studies Reviewed: Additional studies/ records that were reviewed today include: Notes from Dr Kateri Plummer and Dr Haynes Kerns [facility] .    ASSESSMENT AND PLAN:  1.  Chest Pain atypical normal ECG f/u ETT 2. Hypercalcemia labs with primary f/u PTH  3. Kidney Stones: related to hypercalcemia improved continue Vit D   Current medicines are reviewed at length with the patient today.  The patient does not have concerns regarding medicines.  The following changes have been  made:  None   Labs/ tests ordered today include: ETT   Orders Placed This Encounter  Procedures  . EXERCISE TOLERANCE TEST (ETT)  . EKG 12-Lead     Disposition:   FU with me PRN if ETT abnormal      Signed, Charlton HawsPeter Raygen Linquist, MD  05/04/2017 9:57 AM    [facility] [address], [place], [place]  [address] Phone: [phone]; Fax: [phone]

## 2017-05-02 NOTE — H&P (View-Only) (Signed)
Cardiology Office Note   Date:  05/04/2017   ID:  John Bryant, DOB 22-May-1950, MRN [id number]  PCP:  [name], MD  Cardiologist:   Charlton Haws, MD   No chief complaint on file.     History of Present Illness: John Bryant is a 67 y.o. male who presents for consultation regarding chest pain Referred by Dr Kateri Plummer History of hypercalcemia diagnosed in 2017 PTH borderline history of kidney stones Seen by Dr Barnie Mort He is a non smoker Family history of CAD in mother He has had indigestion and chest pressure for a few months can happen at rest or going up stairs. 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 Hypercalcemia   . Kidney stones   . Parathyroid abnormality (HCC)     History reviewed. No pertinent surgical history.   Current Outpatient Medications  Medication Sig Dispense Refill  . calcium carbonate (TUMS - DOSED IN MG ELEMENTAL CALCIUM) 500 MG chewable tablet Chew 1 tablet by mouth daily.    . Cholecalciferol (VITAMIN D) 2000 units CAPS Take 6,000 Units by mouth daily.      No current facility-administered medications for this visit.     Allergies:   No known allergies    Social History:  The patient  reports that  has never smoked. he has never used smokeless tobacco. He reports that he drinks alcohol. He reports that he does not use drugs.   Family History:  The patient's family history includes Dementia in his mother; Lung cancer in his father.    ROS:  Please see the history of present illness.   Otherwise, review of systems are positive for none.   All other systems are reviewed and negative.    PHYSICAL EXAM: VS:  BP 140/84   Pulse 76   Ht 5\' 10"  (1.778 m)   Wt 161 lb 8 oz (73.3 kg)   SpO2 98%   BMI 23.17 kg/m  , BMI Body mass index is 23.17 kg/m. Affect appropriate Healthy:  appears stated age HEENT:  normal Neck supple with no adenopathy JVP normal no bruits no thyromegaly Lungs clear with no wheezing and good diaphragmatic motion Heart:  S1/S2 no murmur, no rub, gallop or click PMI normal Abdomen: benighn, BS positve, no tenderness, no AAA no bruit.  No HSM or HJR Distal pulses intact with no bruits No edema Neuro non-focal Skin warm and dry No muscular weakness    EKG:  NSR normal ECG    Recent Labs: No results found for requested labs within last 8760 hours.    Lipid Panel No results found for: CHOL, TRIG, HDL, CHOLHDL, VLDL, LDLCALC, LDLDIRECT    Wt Readings from Last 3 Encounters:  05/04/17 161 lb 8 oz (73.3 kg)  02/02/17 157 lb 9.6 oz (71.5 kg)  08/03/16 160 lb (72.6 kg)      Other studies Reviewed: Additional studies/ records that were reviewed today include: Notes from Dr Kateri Plummer and Dr Haynes Kerns LABS .    ASSESSMENT AND PLAN:  1.  Chest Pain atypical normal ECG f/u ETT 2. Hypercalcemia labs with primary f/u PTH  3. Kidney Stones: related to hypercalcemia improved continue Vit D   Current medicines are reviewed at length with the patient today.  The patient does not have concerns regarding medicines.  The following changes have been  made:  None   Labs/ tests ordered today include: ETT   Orders Placed This Encounter  Procedures  . EXERCISE TOLERANCE TEST (ETT)  . EKG 12-Lead     Disposition:   FU with me PRN if ETT abnormal      Signed, Charlton HawsPeter Nishan, MD  05/04/2017 9:57 AM    [facility] [address], [place], [place]  [address] Phone: [phone]; Fax: [phone]

## 2017-05-04 ENCOUNTER — Encounter: Payer: Self-pay | Admitting: Cardiovascular Disease

## 2017-05-04 ENCOUNTER — Ambulatory Visit: Payer: 59 | Admitting: Cardiovascular Disease

## 2017-05-04 VITALS — BP 140/84 | HR 76 | Ht 70.0 in | Wt 161.5 lb

## 2017-05-04 DIAGNOSIS — R079 Chest pain, unspecified: Secondary | ICD-10-CM | POA: Diagnosis not present

## 2017-05-04 NOTE — Patient Instructions (Addendum)
Medication Instructions:  Your physician recommends that you continue on your current medications as directed. Please refer to the Current Medication list given to you today.  Labwork: NONE  Testing/Procedures: Your physician has requested that you have an exercise tolerance test. For further information please visit [email or web address]. Please also follow instruction sheet, as given.  Follow-Up: Your physician wants you to follow-up as needed with Dr. Eden EmmsNishan if exercise stress test is normal.  If you need a refill on your cardiac medications before your next appointment, please call your pharmacy.

## 2017-05-18 ENCOUNTER — Ambulatory Visit (INDEPENDENT_AMBULATORY_CARE_PROVIDER_SITE_OTHER): Payer: 59

## 2017-05-18 ENCOUNTER — Telehealth: Payer: Self-pay

## 2017-05-18 ENCOUNTER — Encounter (INDEPENDENT_AMBULATORY_CARE_PROVIDER_SITE_OTHER): Payer: Self-pay

## 2017-05-18 DIAGNOSIS — R079 Chest pain, unspecified: Secondary | ICD-10-CM

## 2017-05-18 LAB — EXERCISE TOLERANCE TEST
CHL CUP MPHR: 156 {beats}/min
CHL CUP RESTING HR STRESS: 78 {beats}/min
CHL RATE OF PERCEIVED EXERTION: 14
CSEPEDS: 32 s
Estimated workload: 7.8 METS
Exercise duration (min): 6 min
Peak HR: 153 {beats}/min
Percent HR: 98 %

## 2017-05-18 MED ORDER — NITROGLYCERIN 0.4 MG SL SUBL: SUBLINGUAL_TABLET | SUBLINGUAL | 3 refills | Status: DC

## 2017-05-18 NOTE — Telephone Encounter (Signed)
Left message for patient to call back for test results. Ordered patient nitro for any chest pain over the weekend.

## 2017-05-18 NOTE — Telephone Encounter (Signed)
-----   Message from Wendall StadePeter C Nishan, MD sent at 05/18/2017  5:04 PM EST ----- Stress test quite positive would set up for heart cath next week

## 2017-05-21 ENCOUNTER — Other Ambulatory Visit (HOSPITAL_COMMUNITY): Payer: Self-pay | Admitting: Cardiovascular Disease

## 2017-05-21 DIAGNOSIS — Z01812 Encounter for preprocedural laboratory examination: Secondary | ICD-10-CM

## 2017-05-21 DIAGNOSIS — R079 Chest pain, unspecified: Secondary | ICD-10-CM

## 2017-05-21 DIAGNOSIS — R9439 Abnormal result of other cardiovascular function study: Secondary | ICD-10-CM

## 2017-05-21 NOTE — Telephone Encounter (Signed)
Follow up    Patient calling wants to change Cath from Wednesday to Thursday

## 2017-05-21 NOTE — Progress Notes (Unsigned)
Ordered lab work for heart cath on Thursday.

## 2017-05-21 NOTE — Telephone Encounter (Signed)
Patient has cath on Thursday and will come be office today to get instructions and lab work done.

## 2017-05-22 ENCOUNTER — Telehealth: Payer: Self-pay | Admitting: *Deleted

## 2017-05-22 ENCOUNTER — Telehealth: Payer: Self-pay | Admitting: Cardiovascular Disease

## 2017-05-22 LAB — BASIC METABOLIC PANEL
BUN / CREAT RATIO: 14 (ref 10–24)
BUN: 15 mg/dL (ref 8–27)
CALCIUM: 10.2 mg/dL (ref 8.6–10.2)
CHLORIDE: 102 mmol/L (ref 96–106)
CO2: 23 mmol/L (ref 20–29)
Creatinine, Ser: 1.04 mg/dL (ref 0.76–1.27)
GFR calc non Af Amer: 76 mL/min/{1.73_m2} (ref >59)
GFR, EST AFRICAN AMERICAN: 87 mL/min/{1.73_m2} (ref >59)
GLUCOSE: 100 mg/dL — AB (ref 65–99)
POTASSIUM: 4.8 mmol/L (ref 3.5–5.2)
Sodium: 139 mmol/L (ref 134–144)

## 2017-05-22 LAB — CBC WITH DIFFERENTIAL/PLATELET
BASOS ABS: 0 10*3/uL (ref 0.0–0.2)
BASOS: 1 %
EOS (ABSOLUTE): 0.1 10*3/uL (ref 0.0–0.4)
Eos: 2 %
Hematocrit: 48.4 % (ref 37.5–51.0)
Hemoglobin: 16.3 g/dL (ref 13.0–17.7)
IMMATURE GRANS (ABS): 0 10*3/uL (ref 0.0–0.1)
IMMATURE GRANULOCYTES: 0 %
LYMPHS: 21 %
Lymphocytes Absolute: 1.1 10*3/uL (ref 0.7–3.1)
MCH: 30.6 pg (ref 26.6–33.0)
MCHC: 33.7 g/dL (ref 31.5–35.7)
MCV: 91 fL (ref 79–97)
MONOS ABS: 0.4 10*3/uL (ref 0.1–0.9)
Monocytes: 7 %
NEUTROS PCT: 69 %
Neutrophils Absolute: 3.6 10*3/uL (ref 1.4–7.0)
PLATELETS: 245 10*3/uL (ref 150–379)
RBC: 5.33 x10E6/uL (ref 4.14–5.80)
RDW: 13.6 % (ref 12.3–15.4)
WBC: 5.2 10*3/uL (ref 3.4–10.8)

## 2017-05-22 LAB — PROTIME-INR
INR: 1 (ref 0.8–1.2)
Prothrombin Time: 10 s (ref 9.1–12.0)

## 2017-05-22 NOTE — Telephone Encounter (Signed)
Patient is concerned about having heart cath and having a stent placed if he has blockage. Patient does not want a stent. Patient wanted to know if there was a way to avoid having a stent. Informed patient that they do not use any dissolving stents, but he could talk this over with Dr. [place] before the heart cath. Patient would like to talk to Dr. Eden EmmsNishan. Patient stated that he would still have the cath, but he wants to try a natural approach on helping with any blockage he might have. Patient stated he would like to work on the "underline cause" before a stent is placed. Patient stated if he gets a stent he would have problems again in 5 years. Patient is adamant about speaking to a doctor about this. Will forward to Dr. Eden EmmsNishan.

## 2017-05-22 NOTE — Telephone Encounter (Signed)
I called him he should be good

## 2017-05-22 NOTE — Telephone Encounter (Signed)
Pt contacted [facility] for: Thursday May 24, 2017 at 7:30 AM [facility] at: 5:30 AM Nothing to eat or drink after midnight the night before cath. Verified no known allergies. Verified no diabetes medications.  Verified AM meds can be  taken pre-cath with sip of water including: ASA 81 mg am of cath   Confirmed patient has responsible person to drive home post procedure and observe patient for 24 hours: yes

## 2017-05-22 NOTE — Telephone Encounter (Signed)
NEW MESSAGE     Patient calling with questions about scheduled heart cath. Please call

## 2017-05-24 ENCOUNTER — Encounter (HOSPITAL_COMMUNITY): Payer: Self-pay | Admitting: Cardiology

## 2017-05-24 ENCOUNTER — Ambulatory Visit (HOSPITAL_COMMUNITY)
Admission: RE | Disposition: A | Discharge status: Home / Self Care | Payer: Self-pay | Source: Ambulatory Visit | Attending: Cardiology

## 2017-05-24 ENCOUNTER — Ambulatory Visit (HOSPITAL_COMMUNITY)
Admission: RE | Admit: 2017-05-24 | Discharge: 2017-05-24 | Disposition: A | Discharge status: Home / Self Care | Payer: 59 | Source: Ambulatory Visit | Attending: Cardiology | Admitting: Cardiology

## 2017-05-24 DIAGNOSIS — R9439 Abnormal result of other cardiovascular function study: Secondary | ICD-10-CM | POA: Diagnosis present

## 2017-05-24 DIAGNOSIS — R079 Chest pain, unspecified: Secondary | ICD-10-CM

## 2017-05-24 DIAGNOSIS — R0789 Other chest pain: Secondary | ICD-10-CM | POA: Diagnosis not present

## 2017-05-24 DIAGNOSIS — Z8249 Family history of ischemic heart disease and other diseases of the circulatory system: Secondary | ICD-10-CM | POA: Insufficient documentation

## 2017-05-24 DIAGNOSIS — Z79899 Other long term (current) drug therapy: Secondary | ICD-10-CM | POA: Diagnosis not present

## 2017-05-24 HISTORY — PX: LEFT HEART CATH AND CORONARY ANGIOGRAPHY: CATH118249

## 2017-05-24 SURGERY — LEFT HEART CATH AND CORONARY ANGIOGRAPHY
Anesthesia: LOCAL

## 2017-05-24 MED ORDER — HEPARIN SODIUM (PORCINE) 1000 UNIT/ML IJ SOLN
INTRAMUSCULAR | Status: DC | PRN
  Administered 2017-05-24: 3500 [IU] via INTRAVENOUS

## 2017-05-24 MED ORDER — MIDAZOLAM HCL 2 MG/2ML IJ SOLN
INTRAMUSCULAR | Status: AC
  Filled 2017-05-24: qty 2

## 2017-05-24 MED ORDER — SODIUM CHLORIDE 0.9 % WEIGHT BASED INFUSION: 1.0000 mL/kg/h | INTRAVENOUS | Status: DC

## 2017-05-24 MED ORDER — SODIUM CHLORIDE 0.9 % WEIGHT BASED INFUSION
3.0000 mL/kg/h | INTRAVENOUS | Status: AC
  Administered 2017-05-24: 3 mL/kg/h via INTRAVENOUS

## 2017-05-24 MED ORDER — LIDOCAINE HCL (PF) 1 % IJ SOLN
INTRAMUSCULAR | Status: DC | PRN
  Administered 2017-05-24: 2 mL via INTRADERMAL

## 2017-05-24 MED ORDER — IOPAMIDOL (ISOVUE-370) INJECTION 76%
INTRAVENOUS | Status: AC
  Filled 2017-05-24: qty 100

## 2017-05-24 MED ORDER — SODIUM CHLORIDE 0.9% FLUSH: 3.0000 mL | Freq: Two times a day (BID) | INTRAVENOUS | Status: DC

## 2017-05-24 MED ORDER — HEPARIN (PORCINE) IN NACL 2-0.9 UNIT/ML-% IJ SOLN
INTRAMUSCULAR | Status: AC | PRN
  Administered 2017-05-24: 1000 mL

## 2017-05-24 MED ORDER — SODIUM CHLORIDE 0.9% FLUSH: 3.0000 mL | INTRAVENOUS | Status: DC | PRN

## 2017-05-24 MED ORDER — SODIUM CHLORIDE 0.9 % IV SOLN: 250.0000 mL | INTRAVENOUS | Status: DC | PRN

## 2017-05-24 MED ORDER — HEPARIN (PORCINE) IN NACL 2-0.9 UNIT/ML-% IJ SOLN
INTRAMUSCULAR | Status: DC | PRN
  Administered 2017-05-24: 10 mL via INTRA_ARTERIAL

## 2017-05-24 MED ORDER — FENTANYL CITRATE (PF) 100 MCG/2ML IJ SOLN
INTRAMUSCULAR | Status: DC | PRN
  Administered 2017-05-24: 25 ug via INTRAVENOUS

## 2017-05-24 MED ORDER — VERAPAMIL HCL 2.5 MG/ML IV SOLN
INTRAVENOUS | Status: AC
  Filled 2017-05-24: qty 2

## 2017-05-24 MED ORDER — HEPARIN SODIUM (PORCINE) 1000 UNIT/ML IJ SOLN
INTRAMUSCULAR | Status: AC
  Filled 2017-05-24: qty 1

## 2017-05-24 MED ORDER — LIDOCAINE HCL 1 % IJ SOLN
INTRAMUSCULAR | Status: AC
  Filled 2017-05-24: qty 20

## 2017-05-24 MED ORDER — HEPARIN (PORCINE) IN NACL 2-0.9 UNIT/ML-% IJ SOLN
INTRAMUSCULAR | Status: AC
  Filled 2017-05-24: qty 1000

## 2017-05-24 MED ORDER — MIDAZOLAM HCL 2 MG/2ML IJ SOLN
INTRAMUSCULAR | Status: DC | PRN
  Administered 2017-05-24: 1 mg via INTRAVENOUS

## 2017-05-24 MED ORDER — FENTANYL CITRATE (PF) 100 MCG/2ML IJ SOLN
INTRAMUSCULAR | Status: AC
  Filled 2017-05-24: qty 2

## 2017-05-24 MED ORDER — IOPAMIDOL (ISOVUE-370) INJECTION 76%
INTRAVENOUS | Status: DC | PRN
  Administered 2017-05-24: 35 mL via INTRA_ARTERIAL

## 2017-05-24 MED ORDER — ASPIRIN 81 MG PO CHEW: 81.0000 mg | CHEWABLE_TABLET | ORAL | Status: DC

## 2017-05-24 SURGICAL SUPPLY — 10 items

## 2017-05-24 NOTE — Discharge Instructions (Signed)

## 2017-05-24 NOTE — Interval H&P Note (Signed)
History and Physical Interval Note:  05/24/2017 7:28 AM  Barrett Henleennis M Schaus  has presented today for surgery, with the diagnosis of positive stress test  The various methods of treatment have been discussed with the patient and family. After consideration of risks, benefits and other options for treatment, the patient has consented to  Procedure(s): LEFT HEART CATH AND CORONARY ANGIOGRAPHY (N/A) as a surgical intervention .  The patient's history has been reviewed, patient examined, no change in status, stable for surgery.  I have reviewed the patient's chart and labs.  Questions were answered to the patient's satisfaction.   Cath Lab Visit (complete for each Cath Lab visit)  Clinical Evaluation Leading to the Procedure:   ACS: No.  Non-ACS:    Anginal Classification: CCS II  Anti-ischemic medical therapy: No Therapy  Non-Invasive Test Results: High-risk stress test findings: cardiac mortality >3%/year  Prior CABG: No previous CABG         Theron Aristaeter Ronald Reagan Ucl[facility] Medical CenterJordanMD,FACC 05/24/2017 7:28 AM

## 2017-05-24 NOTE — Research (Signed)
CADFEM Informed Consent   Subject Name: John Bryant  Subject met inclusion and exclusion criteria.  The informed consent form, study requirements and expectations were reviewed with the subject and questions and concerns were addressed prior to the signing of the consent form.  The subject verbalized understanding of the trail requirements.  The subject agreed to participate in the CADFEM trial and signed the informed consent.  The informed consent was obtained prior to performance of any protocol-specific procedures for the subject.  A copy of the signed informed consent was given to the subject and a copy was placed in the subject's medical record.  [name] 05/24/2017, 06:57 AM

## 2017-05-25 MED FILL — Heparin Sodium (Porcine) 2 Unit/ML in Sodium Chloride 0.9%: INTRAMUSCULAR | Qty: 1000 | Status: AC

## 2017-05-25 MED FILL — Lidocaine HCl Local Inj 1%: INTRAMUSCULAR | Qty: 20 | Status: AC

## 2017-07-17 ENCOUNTER — Telehealth: Payer: Self-pay | Admitting: Cardiovascular Disease

## 2017-07-17 NOTE — Telephone Encounter (Signed)
That's fine with me but his cath was normal not clear he needs cardiology f/u

## 2017-07-17 NOTE — Telephone Encounter (Signed)
New Message:    Pt states he would like to switch from [place] to [place]. I have advised pt that a decision has to be made before we can schedule the appt.

## 2017-07-17 NOTE — Telephone Encounter (Signed)
OK with me if follow up is necessary. Thayer Ohmhris

## 2017-07-18 IMAGING — NM NM PARATHYROID W/ SPECT
3 series · 18 of 18 positions shown · non-contrast
Comparison: None.

CLINICAL DATA: Hyperparathyroidism.  PTH equal 63.

EXAM:
NM PARATHYROID SCINTIGRAPHY AND SPECT IMAGING
TECHNIQUE: Following intravenous administration of radiopharmaceutical, early
and 2-hour delayed planar images were obtained in the anterior
projection. Delayed triplanar SPECT images were also obtained at 2
hours.
RADIOPHARMACEUTICALS:  27.5 mCi Zc-XXm Sestamibi IV

[Series 1: tra vol_tra · 4.1mm · 4.14mm/px · 6 of 128 frames shown]
[frame 11/128]
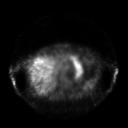
[frame 32/128]
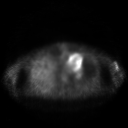
[frame 54/128]
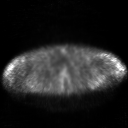
[frame 75/128]
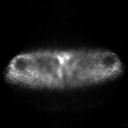
[frame 96/128]
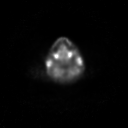
[frame 118/128]
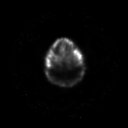

[Series 1: cor vol_cor · 4.1mm · 4.14mm/px · 6 of 128 frames shown]
[frame 11/128]
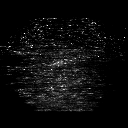
[frame 32/128]
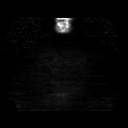
[frame 54/128]
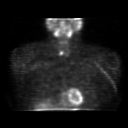
[frame 75/128]
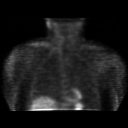
[frame 96/128]
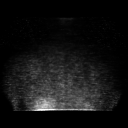
[frame 118/128]
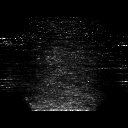

[Series 3: spect parathyroid · 4.14mm/px · 6 of 64 frames shown]
[frame 6/64]
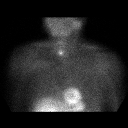
[frame 16/64]
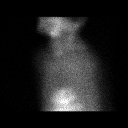
[frame 27/64]
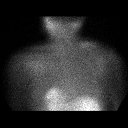
[frame 38/64]
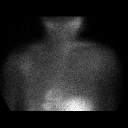
[frame 48/64]
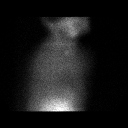
[frame 59/64]
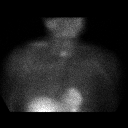

[18 of 18 positions shown; findings below may reference images not displayed]

FINDINGS: 2 hour planar imaging demonstrates focal activity within the RIGHT
lobe of thyroid gland. SPECT imaging of the neck and chest confirms
activity in the RIGHT lobe of thyroid gland. The activity is focal
and positioned posterior to the lower pole of the RIGHT lobe of the
thyroid gland.
IMPRESSION: Focal activity posterior to the lower pole of the RIGHT lobe of
thyroid gland consistent with parathyroid adenoma.

## 2017-07-23 ENCOUNTER — Telehealth: Payer: Self-pay | Admitting: Cardiovascular Disease

## 2017-07-23 DIAGNOSIS — I499 Cardiac arrhythmia, unspecified: Secondary | ICD-10-CM

## 2017-07-23 DIAGNOSIS — R079 Chest pain, unspecified: Secondary | ICD-10-CM

## 2017-07-23 NOTE — Telephone Encounter (Signed)
This is a patient of Dr. Johnsie Cancel. Cardiac cath by dr. [place]. I have not met him. He has asked to be seen in my office in follow up. I would arrange a 48 hour monitor and then keep f/u as planned with office APP in 3 weeks. Gerald Stabs

## 2017-07-23 NOTE — Telephone Encounter (Signed)
I spoke with pt and gave him information from Dr. Clifton JamesMcAlhany. Will ask scheduling to contact him to arrange monitor appointment.  Pt will follow up with Berton BonJanine Hammond, PA as planned on 5/10

## 2017-07-23 NOTE — Telephone Encounter (Signed)
New message  Patient calling with concerns about test discomfort. Patient is requesting additional testing. Patient will be a former [place] patient, approved to see [place].     Pt c/o of Chest Pain: STAT if CP now or developed within 24 hours  1. Are you having CP right now? YES, patient states it is not a pain , but "weird" feeling  2. Are you experiencing any other symptoms (ex. SOB, nausea, vomiting, sweating)? SOB when going up stairs  3. How long have you been experiencing CP? 1 MONTH  4. Is your CP continuous or coming and going? COMING AND GOING  5. Have you taken Nitroglycerin? NO ?

## 2017-07-23 NOTE — Telephone Encounter (Signed)
I spoke with pt who reports he will sometimes feel a "weird feeling" in his chest. He checks his BP when he has this feeling and it will indicate irregular heart beat. This is very concerning to him. He does not feel any palpitations.  Feeling is sometimes relieved when he passes gas.  These feelings started prior to recent cath. Will forward to Dr. Clifton JamesMcAlhany for review/recommendations.

## 2017-08-08 ENCOUNTER — Ambulatory Visit (INDEPENDENT_AMBULATORY_CARE_PROVIDER_SITE_OTHER): Payer: 59

## 2017-08-08 DIAGNOSIS — I499 Cardiac arrhythmia, unspecified: Secondary | ICD-10-CM | POA: Diagnosis not present

## 2017-08-08 DIAGNOSIS — R079 Chest pain, unspecified: Secondary | ICD-10-CM

## 2017-08-17 ENCOUNTER — Ambulatory Visit: Payer: 59 | Admitting: Cardiology

## 2017-08-17 ENCOUNTER — Encounter: Payer: Self-pay | Admitting: Cardiology

## 2017-08-17 VITALS — BP 132/78 | HR 65 | Ht 70.0 in | Wt 165.0 lb

## 2017-08-17 DIAGNOSIS — I493 Ventricular premature depolarization: Secondary | ICD-10-CM | POA: Diagnosis not present

## 2017-08-17 MED ORDER — METOPROLOL TARTRATE 25 MG PO TABS: 25.0000 mg | ORAL_TABLET | Freq: Every day | ORAL | 2 refills | Status: DC | PRN

## 2017-08-17 NOTE — Progress Notes (Signed)
Cardiology Office Note:    Date:  08/17/2017   ID:  John Bryant, DOB 08/10/1950, MRN [id number]  PCP:  [name], MD  Cardiologist:  [name], MD  Referring MD: [name], MD   Chief Complaint  Patient presents with  . Follow-up    holter monitor    History of Present Illness:    John Bryant is a 67 y.o. male with history of parathyroid abnormality.  [name] was first seen in our office by Dr. Eden Emms in 04/2017 for evaluation of chest pain which was felt to be atypical, but an exercise tolerance test was positive and he underwent cardiac catheterization on 05/24/17. The cath revealed no significant CAD and normal LV function. In April he complained of feeling a weird and noted an irregular heartbeat on his blood pressure monitor. A 48 hr holter monitor revealed frequent PVCs but no ventricular tachycardia.  The patient is here today for follow-up of his heart monitor.  He is not aware of any palpitations but feeling of a hand grabbing his heart. Has shortness of breath when he gets to the top of a flight of stairs. He is exercising regularly and has no exertional chest discomfort or dyspnea with that. He denies lightheadedness or syncope. 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 Drinks 1 cup coffee per day. Rarely drinks tea or soda. He drinks kefyr for probiotics and takes fish oil, otherwise no other medications or herbal supplement.   He is under the care of an endocrinologist for parathyroid abnormality and working on getting his calcium levels controlled.    Past Medical History:  Diagnosis Date  . Hypercalcemia   . Kidney stones   . Parathyroid abnormality [facility])     Past Surgical History:  Procedure Laterality Date  . LEFT HEART CATH AND CORONARY ANGIOGRAPHY N/A 05/24/2017   Procedure: LEFT HEART CATH AND CORONARY ANGIOGRAPHY;  Surgeon: Swaziland, Peter M, MD;  Location: [facility] INVASIVE CV  LAB;  Service: Cardiovascular;  Laterality: N/A;    Current Medications: Current Meds  Medication Sig  . Cholecalciferol (VITAMIN D) 2000 units CAPS Take 8,000 Units by mouth daily.   . nitroGLYCERIN (NITROSTAT) 0.4 MG SL tablet take one tab every 5 min up to 3 tabs total over 15 min If no relief CALL 911.  Marland Kitchen OVER THE COUNTER MEDICATION Take 1,560 mg by mouth daily. Fish Oil Liquid Solution  Takes 1 tsp daily  . [DISCONTINUED] calcium carbonate (TUMS - DOSED IN MG ELEMENTAL CALCIUM) 500 MG chewable tablet Chew 1 tablet by mouth daily as needed for indigestion or heartburn.      Allergies:   No known allergies   Social History   Socioeconomic History  . Marital status: Divorced    Spouse name: Not on file  . Number of children: Not on file  . Years of education: Not on file  . Highest education level: Not on file  Occupational History  . Not on file  Social Needs  . Financial resource strain: Not on file  . Food insecurity:    Worry: Not on file    Inability: Not on file  . Transportation needs:    Medical: Not on file    Non-medical: Not on file  Tobacco Use  . Smoking status: Never Smoker  . Smokeless tobacco: Never Used  Substance and Sexual Activity  . Alcohol use: Yes    Comment: Rare  .  Drug use: No  . Sexual activity: Not on file  Lifestyle  . Physical activity:    Days per week: Not on file    Minutes per session: Not on file  . Stress: Not on file  Relationships  . Social connections:    Talks on phone: Not on file    Gets together: Not on file    Attends religious service: Not on file    Active member of club or organization: Not on file    Attends meetings of clubs or organizations: Not on file    Relationship status: Not on file  Other Topics Concern  . Not on file  Social History Narrative  . Not on file     Family History: The patient's family history includes Dementia in his mother; Lung cancer in his father. There is no history of  Hyperparathyroidism. ROS:   Please see the history of present illness.     All other systems reviewed and are negative.  EKGs/Labs/Other Studies Reviewed:    The following studies were reviewed today:  48 hour Holter monitor 08/08/2017 Sinus rhythm  Frequent Premature ventricular contractions (5455 during monitoring period) with bigeminy and couplets. He has several fusion beats but no ventricular tachycardia.  Rare premature atrial contractions. (21 during monitoring period).   Recent cath with mild CAD and normal LV function. Consider addition of a beta blocker for suppression of PVCs.   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  Ost LM lesion is 25% stenosed.  The left ventricular systolic function is normal.  LV end diastolic pressure is normal.  The left ventricular ejection fraction is 55-65% by visual estimate.   1. No significant CAD 2. Normal LV function 3. Normal LVEDP  Plan: It appears his ETT is falsely abnormal. Recommend risk factor modification.      EKG:  EKG is not ordered today.    Recent Labs: 05/21/2017: BUN 15; Creatinine, Ser 1.04; Hemoglobin 16.3; Platelets 245; Potassium 4.8; Sodium 139   Recent Lipid Panel No results found for: CHOL, TRIG, HDL, CHOLHDL, VLDL, LDLCALC, LDLDIRECT  Physical Exam:    VS:  BP 132/78 (BP Location: Right Arm, Patient Position: Sitting, Cuff Size: Normal)   Pulse 65   Ht  (1.778 m)   Wt 165 lb (74.8 kg)   SpO2 99%   BMI 23.68 kg/m     Wt Readings from Last 3 Encounters:  08/17/17 165 lb (74.8 kg)  05/24/17 151 lb (68.5 kg)  05/04/17 161 lb 8 oz (73.3 kg)     Physical Exam  Constitutional: He is oriented to person, place, and time. He appears well-developed and well-nourished. No distress.  HENT:  Head: Normocephalic and atraumatic.  Neck: Normal range of motion. Neck supple. No JVD present.  Cardiovascular: Normal rate, regular rhythm, normal heart  sounds and intact distal pulses. Exam reveals no gallop and no friction rub.  No murmur heard. Pulmonary/Chest: Effort normal and breath sounds normal. No respiratory distress. He has no wheezes. He has no rales.  Abdominal: Soft. Bowel sounds are normal. He exhibits no distension.  Musculoskeletal: Normal range of motion. He exhibits no edema or deformity.  Neurological: He is alert and oriented to person, place, and time.  Skin: Skin is warm and dry.  Psychiatric: He has a normal mood and affect. His behavior is normal. Thought content normal.  Vitals reviewed.    ASSESSMENT:    1. PVC (premature ventricular contraction)  PLAN:    In order of problems listed above:  PVCs: no ischemic heart disease on cath in 05/2017. Holter monitor showed frequent PVC's, but no ventricular tachycardia Will add metoprolol 25 mg. Pt does not want to take a daily medication. We discussed taking it as needed and if it is helping he can take it daily. He is followed with endocrinology and correction of electrolytes may help. Advised to limit caffeine intake and try to reduce his stress levels. He is going to try stopping the Kefyr to see if that helps. He feels comfortable with following up as needed.     Medication Adjustments/Labs and Tests Ordered: Current medicines are reviewed at length with the patient today.  Concerns regarding medicines are outlined above. Labs and tests ordered and medication changes are outlined in the patient instructions below:  Patient Instructions  Medication Instructions:    START TAKING METOPROLOL 25 MG DAILY AS NEEDED FOR PALPITATIONS..   If you need a refill on your cardiac medications before your next appointment, please call your pharmacy.  Labwork: NONE ORDERED  TODAY    Testing/Procedures: NONE ORDERED  TODAY      Follow-Up:  CONTACT [facility] [phone] AS NEEDED FOR  ANY CARDIAC RELATED SYMPTOMS    Any Other Special Instructions Will Be  Listed Below (If Applicable).     Premature Ventricular Contraction A premature ventricular contraction (PVC) is a common irregularity in the normal heart rhythm. These contractions are extra heartbeats that start in the heart ventricles and occur too early in the normal sequence. During the PVC, the heart's normal electrical pathway is not used, so the beat is shorter and less effective. In most cases, these contractions come and go and do not require treatment. What are the causes? In many cases, the cause may not be known. Common causes of the condition include:  Smoking.  Drinking alcohol.  Caffeine.  Certain medicines.  Some illegal drugs.  Stress.  Certain medical conditions can also cause PVCs:  Changes in minerals in the blood (electrolytes).  Heart failure.  Heart valve problems.  Low blood oxygen levels or high carbon dioxide levels.  Heart attack, or coronary artery disease.  What are the signs or symptoms? The main symptom of this condition is a fast or skipped heartbeat (palpitations). Other symptoms include:  Chest pain.  Shortness of breath.  Feeling tired.  Dizziness.  In some cases, there are no symptoms. How is this diagnosed? This condition may be diagnosed based on:  Your medical history.  A physical exam. During the exam, the health care provider will check for irregular heartbeats.  Tests, such as: ? An ECG (electrocardiogram) to monitor the electrical activity of your heart. ? Holter monitor testing. This involves wearing a device that clips to your clothing and monitors the electrical activity of your heart over longer periods of time. ? Stress tests to see how exercise affects your heart rhythm and blood supply. ? Echocardiogram. This test uses sound waves (ultrasound) to produce an image of your heart. ? Electrophysiology study. This test checks the electric pathways in your heart.  How is this treated? Treatment depends on any  underlying conditions, the type of PVCs that you are having, and how much the symptoms are interfering with your daily life. Possible treatments include:  Avoiding things that can trigger the premature contractions, such as caffeine or alcohol.  Medicines. These may be given if symptoms are severe or if the extra heartbeats are frequent.  Treatment for any underlying condition that is found to be the cause of the contractions.  Catheter ablation. This procedure destroys the heart tissues that send abnormal signals.  In some cases, no treatment is required. Follow these instructions at home: Lifestyle Follow these instructions as told by your health care provider:  Do not use any products that contain nicotine or tobacco, such as cigarettes and e-cigarettes. If you need help quitting, ask your health care provider.  If caffeine triggers episodes of PVC, do not eat, drink, or use anything with caffeine in it.  If caffeine does not seem to trigger episodes, consume caffeine in moderation.  If alcohol triggers episodes of PVC, do not drink alcohol.  If alcohol does not seem to trigger episodes, limit alcohol intake to no more than 1 drink a day for nonpregnant women and 2 drinks a day for men. One drink equals 12 oz of beer, 5 oz of wine, or 1 oz of hard liquor.  Exercise regularly. Ask your health care provider what type of exercise is safe for you.  Find healthy ways to manage stress. Avoid stressful situations when possible.  Try to get at least 7-9 hours of sleep each night, or as much as recommended by your health care provider.  Do not use illegal drugs.  General instructions  Take over-the-counter and prescription medicines only as told by your health care provider.  Keep all follow-up visits as told by your health care provider. This is important. Get help right away if:  You feel palpitations that are frequent or continual.  You have chest pain.  You have shortness  of breath.  You have sweating for no reason.  You have nausea and vomiting.  You become light-headed or you faint. This information is not intended to replace advice given to you by your health care provider. Make sure you discuss any questions you have with your health care provider. Document Released: 11/12/2003 Document Revised: 11/19/2015 Document Reviewed: 09/01/2015 Elsevier Interactive Patient Education  2018 [employer].    Follow-Up:   Any Other Special Instructions Will Be Listed Below (If Applicable).                                                                                                                                                      Signed, Berton Bon, NP  08/17/2017 10:23 AM    [facility]

## 2017-08-17 NOTE — Patient Instructions (Addendum)
Medication Instructions:    START TAKING METOPROLOL 25 MG DAILY AS NEEDED FOR PALPITATIONS..   If you need a refill on your cardiac medications before your next appointment, please call your pharmacy.  Labwork: NONE ORDERED  TODAY    Testing/Procedures: NONE ORDERED  TODAY      Follow-Up:  CONTACT [facility] [phone] AS NEEDED FOR  ANY CARDIAC RELATED SYMPTOMS    Any Other Special Instructions Will Be Listed Below (If Applicable).     Premature Ventricular Contraction A premature ventricular contraction (PVC) is a common irregularity in the normal heart rhythm. These contractions are extra heartbeats that start in the heart ventricles and occur too early in the normal sequence. During the PVC, the heart's normal electrical pathway is not used, so the beat is shorter and less effective. In most cases, these contractions come and go and do not require treatment. What are the causes? In many cases, the cause may not be known. Common causes of the condition include:  Smoking.  Drinking alcohol.  Caffeine.  Certain medicines.  Some illegal drugs.  Stress.  Certain medical conditions can also cause PVCs:  Changes in minerals in the blood (electrolytes).  Heart failure.  Heart valve problems.  Low blood oxygen levels or high carbon dioxide levels.  Heart attack, or coronary artery disease.  What are the signs or symptoms? The main symptom of this condition is a fast or skipped heartbeat (palpitations). Other symptoms include:  Chest pain.  Shortness of breath.  Feeling tired.  Dizziness.  In some cases, there are no symptoms. How is this diagnosed? This condition may be diagnosed based on:  Your medical history.  A physical exam. During the exam, the health care provider will check for irregular heartbeats.  Tests, such as: ? An ECG (electrocardiogram) to monitor the electrical activity of your heart. ? Holter monitor testing. This  involves wearing a device that clips to your clothing and monitors the electrical activity of your heart over longer periods of time. ? Stress tests to see how exercise affects your heart rhythm and blood supply. ? Echocardiogram. This test uses sound waves (ultrasound) to produce an image of your heart. ? Electrophysiology study. This test checks the electric pathways in your heart.  How is this treated? Treatment depends on any underlying conditions, the type of PVCs that you are having, and how much the symptoms are interfering with your daily life. Possible treatments include:  Avoiding things that can trigger the premature contractions, such as caffeine or alcohol.  Medicines. These may be given if symptoms are severe or if the extra heartbeats are frequent.  Treatment for any underlying condition that is found to be the cause of the contractions.  Catheter ablation. This procedure destroys the heart tissues that send abnormal signals.  In some cases, no treatment is required. Follow these instructions at home: Lifestyle Follow these instructions as told by your health care provider:  Do not use any products that contain nicotine or tobacco, such as cigarettes and e-cigarettes. If you need help quitting, ask your health care provider.  If caffeine triggers episodes of PVC, do not eat, drink, or use anything with caffeine in it.  If caffeine does not seem to trigger episodes, consume caffeine in moderation.  If alcohol triggers episodes of PVC, do not drink alcohol.  If alcohol does not seem to trigger episodes, limit alcohol intake to no more than 1 drink a day for nonpregnant women and 2 drinks a  day for men. One drink equals 12 oz of beer, 5 oz of wine, or 1 oz of hard liquor.  Exercise regularly. Ask your health care provider what type of exercise is safe for you.  Find healthy ways to manage stress. Avoid stressful situations when possible.  Try to get at least 7-9 hours  of sleep each night, or as much as recommended by your health care provider.  Do not use illegal drugs.  General instructions  Take over-the-counter and prescription medicines only as told by your health care provider.  Keep all follow-up visits as told by your health care provider. This is important. Get help right away if:  You feel palpitations that are frequent or continual.  You have chest pain.  You have shortness of breath.  You have sweating for no reason.  You have nausea and vomiting.  You become light-headed or you faint. This information is not intended to replace advice given to you by your health care provider. Make sure you discuss any questions you have with your health care provider. Document Released: 11/12/2003 Document Revised: 11/19/2015 Document Reviewed: 09/01/2015 Elsevier Interactive Patient Education  2018 [employer].    Follow-Up:   Any Other Special Instructions Will Be Listed Below (If Applicable).

## 2017-08-28 ENCOUNTER — Encounter: Payer: Self-pay | Admitting: Endocrinology

## 2017-08-28 ENCOUNTER — Ambulatory Visit (INDEPENDENT_AMBULATORY_CARE_PROVIDER_SITE_OTHER): Payer: 59 | Admitting: Endocrinology

## 2017-08-28 NOTE — Patient Instructions (Addendum)
Blood tests are requested for you today, to be done at [facility].  Also, let's check a bone density test.  We'll let you know about both results.  Please come back for a follow-up appointment in 6 months.

## 2017-08-28 NOTE — Progress Notes (Signed)
Subjective:    Patient ID: John Bryant, male    DOB: [birth year], 67 y.o.   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  No recent episodes of urolithiasis.  pt states he feels well in general, except for knee pain.   Past Medical History:  Diagnosis Date  . Hypercalcemia   . Kidney stones   . Parathyroid abnormality [facility])     Past Surgical History:  Procedure Laterality Date  . LEFT HEART CATH AND CORONARY ANGIOGRAPHY N/A 05/24/2017   Procedure: LEFT HEART CATH AND CORONARY ANGIOGRAPHY;  Surgeon: Swaziland, Peter M, MD;  Location: [facility] INVASIVE CV LAB;  Service: Cardiovascular;  Laterality: N/A;    Social History   Socioeconomic History  . Marital status: Divorced    Spouse name: Not on file  . Number of children: Not on file  . Years of education: Not on file  . Highest education level: Not on file  Occupational History  . Not on file  Social Needs  . Financial resource strain: Not on file  . Food insecurity:    Worry: Not on file    Inability: Not on file  . Transportation needs:    Medical: Not on file    Non-medical: Not on file  Tobacco Use  . Smoking status: Never Smoker  . Smokeless tobacco: Never Used  Substance and Sexual Activity  . Alcohol use: Yes    Comment: Rare  . Drug use: No  . Sexual activity: Not on file  Lifestyle  . Physical activity:    Days per week: Not on file    Minutes per session: Not on file  . Stress: Not on file  Relationships  . Social connections:    Talks on phone: Not on file    Gets together: Not on file    Attends religious service: Not on file    Active member of club or organization: Not on file    Attends meetings of clubs or organizations: Not on file    Relationship status: Not on file  . Intimate partner violence:    Fear of current or ex  partner: Not on file    Emotionally abused: Not on file    Physically abused: Not on file    Forced sexual activity: Not on file  Other Topics Concern  . Not on file  Social History Narrative  . Not on file    Current Outpatient Medications on File Prior to Visit  Medication Sig Dispense Refill  . Cholecalciferol (VITAMIN D) 2000 units CAPS Take 8,000 Units by mouth daily.     Marland Kitchen OVER THE COUNTER MEDICATION Take 1,560 mg by mouth daily. Fish Oil Liquid Solution  Takes 1 tsp daily    . metoprolol tartrate (LOPRESSOR) 25 MG tablet Take 1 tablet (25 mg total) by mouth daily as needed (PALPITATIONS). (Patient not taking: Reported on 08/28/2017) 30 tablet 2  . nitroGLYCERIN (NITROSTAT) 0.4 MG SL tablet take one tab every 5 min up to 3 tabs total over 15 min If no relief CALL 911. (Patient not taking: Reported on 08/28/2017) 25 tablet 3   No current facility-administered medications on file prior to visit.     Allergies  Allergen Reactions  . No Known Allergies     Family History  Problem Relation Age of Onset  .  Dementia Mother   . Lung cancer Father   . Hyperparathyroidism Neg Hx     BP 138/84   Pulse 65   Wt 163 lb 12.8 oz (74.3 kg)   SpO2 97%   BMI 23.50 kg/m    Review of Systems No numbness.     Objective:   Physical Exam VITAL SIGNS:  See vs page GENERAL: no distress Gait: normal and steady      Assessment & Plan:  Hypercalcemia: recheck today.  Vit-D deficiency: recheck today.  Patient Instructions  Blood tests are requested for you today, to be done at [facility].  Also, let's check a bone density test.  We'll let you know about both results.  Please come back for a follow-up appointment in 6 months.

## 2017-08-31 ENCOUNTER — Other Ambulatory Visit: Payer: 59

## 2017-08-31 ENCOUNTER — Ambulatory Visit (INDEPENDENT_AMBULATORY_CARE_PROVIDER_SITE_OTHER)
Admission: RE | Admit: 2017-08-31 | Discharge: 2017-08-31 | Disposition: A | Discharge status: Home / Self Care | Payer: 59 | Source: Ambulatory Visit | Attending: Endocrinology | Admitting: Endocrinology

## 2017-09-06 ENCOUNTER — Telehealth: Payer: Self-pay | Admitting: Endocrinology

## 2017-09-06 NOTE — Telephone Encounter (Signed)
Lab orders faxed to number below. Pt informed

## 2017-09-06 NOTE — Telephone Encounter (Signed)
Patient stated they need orders sent over to [facility] to have his blood drawn   FAX- [phone]

## 2017-09-18 ENCOUNTER — Other Ambulatory Visit: Payer: Self-pay | Admitting: Endocrinology

## 2017-09-19 LAB — PTH, INTACT AND CALCIUM
CALCIUM: 10.7 mg/dL — AB (ref 8.6–10.2)
PTH: 67 pg/mL — ABNORMAL HIGH (ref 15–65)

## 2017-09-19 LAB — VITAMIN D 25 HYDROXY (VIT D DEFICIENCY, FRACTURES): VIT D 25 HYDROXY: 40.1 ng/mL (ref 30.0–100.0)

## 2018-12-02 ENCOUNTER — Other Ambulatory Visit: Payer: Self-pay

## 2018-12-04 ENCOUNTER — Encounter: Payer: Self-pay | Admitting: Endocrinology

## 2018-12-04 ENCOUNTER — Other Ambulatory Visit: Payer: Self-pay | Admitting: Endocrinology

## 2018-12-04 ENCOUNTER — Other Ambulatory Visit: Payer: Self-pay

## 2018-12-04 ENCOUNTER — Ambulatory Visit (INDEPENDENT_AMBULATORY_CARE_PROVIDER_SITE_OTHER): Payer: 59 | Admitting: Endocrinology

## 2018-12-04 DIAGNOSIS — E559 Vitamin D deficiency, unspecified: Secondary | ICD-10-CM

## 2018-12-04 DIAGNOSIS — E213 Hyperparathyroidism, unspecified: Secondary | ICD-10-CM | POA: Diagnosis not present

## 2018-12-04 NOTE — Progress Notes (Signed)
Subjective:    Patient ID: John Bryant, male    DOB: 1950/11/20, 68 y.o.   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  Pt reports slight pain at the left flank, and assoc arthralgias.   Past Medical History:  Diagnosis Date  . Hypercalcemia   . Kidney stones   . Parathyroid abnormality [facility](HCC)     Past Surgical History:  Procedure Laterality Date  . LEFT HEART CATH AND CORONARY ANGIOGRAPHY N/A 05/24/2017   Procedure: LEFT HEART CATH AND CORONARY ANGIOGRAPHY;  Surgeon: SwazilandJordan, Peter M, MD;  Location: [facility] INVASIVE CV LAB;  Service: Cardiovascular;  Laterality: N/A;    Social History   Socioeconomic History  . Marital status: Divorced    Spouse name: Not on file  . Number of children: Not on file  . Years of education: Not on file  . Highest education level: Not on file  Occupational History  . Not on file  Social Needs  . Financial resource strain: Not on file  . Food insecurity    Worry: Not on file    Inability: Not on file  . Transportation needs    Medical: Not on file    Non-medical: Not on file  Tobacco Use  . Smoking status: Never Smoker  . Smokeless tobacco: Never Used  Substance and Sexual Activity  . Alcohol use: Yes    Comment: Rare  . Drug use: No  . Sexual activity: Not on file  Lifestyle  . Physical activity    Days per week: Not on file    Minutes per session: Not on file  . Stress: Not on file  Relationships  . Social Musicianconnections    Talks on phone: Not on file    Gets together: Not on file    Attends religious service: Not on file    Active member of club or organization: Not on file    Attends meetings of clubs or organizations: Not on file   Relationship status: Not on file  . Intimate partner violence    Fear of current or ex partner: Not on file    Emotionally abused: Not on file    Physically abused: Not on file    Forced sexual activity: Not on file  Other Topics Concern  . Not on file  Social History Narrative  . Not on file    Current Outpatient Medications on File Prior to Visit  Medication Sig Dispense Refill  . Omega-3 Fatty Acids (FISH OIL PO) Take 1 tablet by mouth daily.    Marland Kitchen. VITAMIN D PO Take 8,000 Units by mouth daily.      No current facility-administered medications on file prior to visit.     Allergies  Allergen Reactions  . No Known Allergies     Family History  Problem Relation Age of Onset  . Dementia Mother   . Lung cancer Father   . Hyperparathyroidism Neg Hx     BP (!) 158/80 (BP Location: Left Arm, Patient Position: Sitting, Cuff Size: Normal)   Pulse 72   Ht 5\' 10"  (1.778 m)   Wt 165 lb 9.6 oz (75.1 kg)   SpO2 97%   BMI 23.76 kg/m  Review of Systems Denies hematuria and diarrhea.     Objective:   Physical Exam VITAL SIGNS:  See vs page GENERAL: no distress.   Gait: normal and steady.    Lab Results  Component Value Date   PTH 65 12/04/2018   PTH Comment 12/04/2018   CALCIUM 10.3 (H) 12/04/2018   PHOS 2.2 (L) 01/11/2016   25-OH vit-D=normal    Assessment & Plan:  HTN: is noted today Hypercalcemia: better--now just borderline Hyperparathyroidism.  Improved.  We'll follow. Vit-D def: well-controlled.  Please continue the same medications.  Patient Instructions  Your blood pressure is high today.  Please see your primary care provider soon, to have it rechecked Blood tests are requested for you today, to be done at [place] ([address], [place] [address]).  Please come back for a follow-up appointment in 6 months.

## 2018-12-04 NOTE — Patient Instructions (Addendum)
Your blood pressure is high today.  Please see your primary care provider soon, to have it rechecked Blood tests are requested for you today, to be done at [place] ([address], [place] [address]).  Please come back for a follow-up appointment in 6 months.

## 2018-12-05 LAB — BASIC METABOLIC PANEL
BUN/Creatinine Ratio: 17 (ref 10–24)
BUN: 18 mg/dL (ref 8–27)
CO2: 23 mmol/L (ref 20–29)
Calcium: 10.3 mg/dL — ABNORMAL HIGH (ref 8.6–10.2)
Chloride: 105 mmol/L (ref 96–106)
Creatinine, Ser: 1.03 mg/dL (ref 0.76–1.27)
GFR calc Af Amer: 87 mL/min/{1.73_m2} (ref >59)
GFR calc non Af Amer: 75 mL/min/{1.73_m2} (ref >59)
Glucose: 90 mg/dL (ref 65–99)
Potassium: 4.7 mmol/L (ref 3.5–5.2)
Sodium: 139 mmol/L (ref 134–144)

## 2018-12-05 LAB — PTH, INTACT AND CALCIUM: PTH: 65 pg/mL (ref 15–65)

## 2018-12-05 LAB — VITAMIN D 25 HYDROXY (VIT D DEFICIENCY, FRACTURES): Vit D, 25-Hydroxy: 43.6 ng/mL (ref 30.0–100.0)

## 2019-06-02 ENCOUNTER — Other Ambulatory Visit: Payer: Self-pay

## 2019-06-04 ENCOUNTER — Other Ambulatory Visit: Payer: Self-pay

## 2019-06-04 ENCOUNTER — Encounter: Payer: Self-pay | Admitting: Endocrinology

## 2019-06-04 ENCOUNTER — Ambulatory Visit: Payer: 59 | Admitting: Endocrinology

## 2019-06-04 DIAGNOSIS — M2550 Pain in unspecified joint: Secondary | ICD-10-CM

## 2019-06-04 NOTE — Patient Instructions (Addendum)
Blood tests are requested for you today.  We'll let you know about the results.  Please come back for a follow-up appointment in 6 months.   

## 2019-06-04 NOTE — Addendum Note (Signed)
Addended by: Adline Mango I on: 06/04/2019 04:11 PM   Modules accepted: Orders

## 2019-06-04 NOTE — Addendum Note (Signed)
Addended by: Adline Mango I on: 06/04/2019 04:10 PM   Modules accepted: Orders

## 2019-06-04 NOTE — Progress Notes (Signed)
Subjective:    Patient ID: John Bryant, male    DOB: [birth year], 69 y.o.   MRN: [id number]  HPI Pt returns for f/u of mild hypercalcemia (dx'ed 2017; PTH was borderline high; he has had urolithiasis since approx 2010; w/u has been neg: DEXA in 2019 was normal; he has no h/o bony fractures; PTH scan showed activity at the right lower parathyroid area).   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  pt states he feels well in general, except for arthralgias.   Past Medical History:  Diagnosis Date  . Hypercalcemia   . Kidney stones   . Parathyroid abnormality [facility])     Past Surgical History:  Procedure Laterality Date  . LEFT HEART CATH AND CORONARY ANGIOGRAPHY N/A 05/24/2017   Procedure: LEFT HEART CATH AND CORONARY ANGIOGRAPHY;  Surgeon: Swaziland, Peter M, MD;  Location: [facility] INVASIVE CV LAB;  Service: Cardiovascular;  Laterality: N/A;    Social History   Socioeconomic History  . Marital status: Divorced    Spouse name: Not on file  . Number of children: Not on file  . Years of education: Not on file  . Highest education level: Not on file  Occupational History  . Not on file  Tobacco Use  . Smoking status: Never Smoker  . Smokeless tobacco: Never Used  Substance and Sexual Activity  . Alcohol use: Yes    Comment: Rare  . Drug use: No  . Sexual activity: Not on file  Other Topics Concern  . Not on file  Social History Narrative  . Not on file   Social Determinants of Health   Financial Resource Strain:   . Difficulty of Paying Living Expenses: Not on file  Food Insecurity:   . Worried About Programme researcher, broadcasting/film/video in the Last Year: Not on file  . Ran Out of Food in the Last Year: Not on file  Transportation Needs:   . Lack of Transportation (Medical): Not on file  . Lack of Transportation (Non-Medical): Not on file  Physical Activity:   . Days of Exercise per Week: Not on file  . Minutes of Exercise per Session: Not on file  Stress:   . Feeling of  Stress : Not on file  Social Connections:   . Frequency of Communication with Friends and Family: Not on file  . Frequency of Social Gatherings with Friends and Family: Not on file  . Attends Religious Services: Not on file  . Active Member of Clubs or Organizations: Not on file  . Attends Banker Meetings: Not on file  . Marital Status: Not on file  Intimate Partner Violence:   . Fear of Current or Ex-Partner: Not on file  . Emotionally Abused: Not on file  . Physically Abused: Not on file  . Sexually Abused: Not on file    Current Outpatient Medications on File Prior to Visit  Medication Sig Dispense Refill  . TURMERIC PO Take 1 tablet by mouth daily.    Marland Kitchen VITAMIN D PO Take 8,000 Units by mouth daily.      No current facility-administered medications on file prior to visit.    Allergies  Allergen Reactions  . No Known Allergies     Family History  Problem Relation Age of Onset  . Dementia Mother   . Lung cancer Father   . Hyperparathyroidism Neg Hx     BP (!) 164/102 (BP Location: Left Arm, Patient Position: Sitting, Cuff Size: Normal)  Pulse 79   Ht 5\' 10"  (1.778 m)   Wt 159 lb 9.6 oz (72.4 kg)   SpO2 96%   BMI 22.90 kg/m    Review of Systems He passed a urolith 1 month ago.     Objective:   Physical Exam VITAL SIGNS:  See vs page GENERAL: no distress NECK: There is no palpable thyroid enlargement.  No thyroid nodule is palpable.  No palpable lymphadenopathy at the anterior neck.   Lab Results  Component Value Date   PTH 72 (H) 06/04/2019   PTH Comment 06/04/2019   CALCIUM 10.1 06/04/2019   PHOS 2.2 (L) 01/11/2016       Assessment & Plan:  Hyperparathyroidism, primary: stable Hypercalcemia: better Vit-D def: recheck today  Patient Instructions  Blood tests are requested for you today.  We'll let you know about the results.  Please come back for a follow-up appointment in 6 months.

## 2019-06-05 LAB — VITAMIN D 25 HYDROXY (VIT D DEFICIENCY, FRACTURES): Vit D, 25-Hydroxy: 36.7 ng/mL (ref 30.0–100.0)

## 2019-06-05 LAB — SEDIMENTATION RATE: Sed Rate: 9 mm/hr (ref 0–30)

## 2019-06-05 LAB — RHEUMATOID FACTOR: Rheumatoid fact SerPl-aCnc: <10 IU/mL (ref 0.0–13.9)

## 2019-06-05 LAB — PTH, INTACT AND CALCIUM
Calcium: 10.1 mg/dL (ref 8.6–10.2)
PTH: 72 pg/mL — ABNORMAL HIGH (ref 15–65)

## 2019-12-02 ENCOUNTER — Ambulatory Visit (INDEPENDENT_AMBULATORY_CARE_PROVIDER_SITE_OTHER): Payer: Medicare Other | Admitting: Endocrinology

## 2019-12-02 ENCOUNTER — Other Ambulatory Visit: Payer: Self-pay

## 2019-12-02 ENCOUNTER — Encounter: Payer: Self-pay | Admitting: Endocrinology

## 2019-12-02 DIAGNOSIS — E559 Vitamin D deficiency, unspecified: Secondary | ICD-10-CM

## 2019-12-02 NOTE — Patient Instructions (Addendum)
Blood tests are requested for you today.  We'll let you know about the results.  Please come back for a follow-up appointment in 6 months.   

## 2019-12-02 NOTE — Progress Notes (Signed)
Subjective:    Patient ID: John Bryant, male    DOB: 11/28/1950, 69 y.o.   MRN: [id number]  HPI Pt returns for f/u of mild hypercalcemia (dx'ed 2017; PTH was borderline high; he has had urolithiasis since approx 2010; w/u of hypercalcemia has otherwise been neg; 24-HR urine Ca++ was 324 mg; DEXA in 2019 was normal; he has no h/o bony fractures; PTH scan showed activity at the right lower parathyroid area).   Pt also has Vit-D deficiency (dx'ed 2017; he takes 8000 units/day).  arthralgias are unchanged.  No recent episodes of urolithiasis.   Past Medical History:  Diagnosis Date   Hypercalcemia    Kidney stones    Parathyroid abnormality [facility])     Past Surgical History:  Procedure Laterality Date   LEFT HEART CATH AND CORONARY ANGIOGRAPHY N/A 05/24/2017   Procedure: LEFT HEART CATH AND CORONARY ANGIOGRAPHY;  Surgeon: Swaziland, Peter M, MD;  Location: [facility] INVASIVE CV LAB;  Service: Cardiovascular;  Laterality: N/A;    Social History   Socioeconomic History   Marital status: Divorced    Spouse name: Not on file   Number of children: Not on file   Years of education: Not on file   Highest education level: Not on file  Occupational History   Not on file  Tobacco Use   Smoking status: Never Smoker   Smokeless tobacco: Never Used  Substance and Sexual Activity   Alcohol use: Yes    Comment: Rare   Drug use: No   Sexual activity: Not on file  Other Topics Concern   Not on file  Social History Narrative   Not on file   Social Determinants of Health   Financial Resource Strain:    Difficulty of Paying Living Expenses: Not on file  Food Insecurity:    Worried About Running Out of Food in the Last Year: Not on file   Ran Out of Food in the Last Year: Not on file  Transportation Needs:    Lack of Transportation (Medical): Not on file   Lack of Transportation (Non-Medical): Not on file  Physical Activity:    Days of Exercise per Week: Not on file    Minutes of Exercise per Session: Not on file  Stress:    Feeling of Stress : Not on file  Social Connections:    Frequency of Communication with Friends and Family: Not on file   Frequency of Social Gatherings with Friends and Family: Not on file   Attends Religious Services: Not on file   Active Member of Clubs or Organizations: Not on file   Attends Banker Meetings: Not on file   Marital Status: Not on file  Intimate Partner Violence:    Fear of Current or Ex-Partner: Not on file   Emotionally Abused: Not on file   Physically Abused: Not on file   Sexually Abused: Not on file    Current Outpatient Medications on File Prior to Visit  Medication Sig Dispense Refill   TURMERIC PO Take 1 tablet by mouth daily.     VITAMIN D PO Take 8,000 Units by mouth daily.      No current facility-administered medications on file prior to visit.    Allergies  Allergen Reactions   No Known Allergies     Family History  Problem Relation Age of Onset   Dementia Mother    Lung cancer Father    Hyperparathyroidism Neg Hx     BP (!) 150/80  Pulse 75    Ht 5\' 10"  (1.778 m)    Wt 157 lb 6.4 oz (71.4 kg)    SpO2 99%    BMI 22.58 kg/m    Review of Systems Denies numbness and muscle cramps.    Objective:   Physical Exam VITAL SIGNS:  See vs page GENERAL: no distress GAIT: normal and steady  Lab Results  Component Value Date   CREATININE 1.03 12/04/2018   BUN 18 12/04/2018   NA 139 12/04/2018   K 4.7 12/04/2018   CL 105 12/04/2018   CO2 23 12/04/2018   25-OH Vit-D=64    Assessment & Plan:  Vit-D def: well-replaced.  Reduce to 6000 units/d.  Hyperparathyroidism: recheck today.   Patient Instructions  Blood tests are requested for you today.  We'll let you know about the results.  Please come back for a follow-up appointment in 6 months.

## 2019-12-03 LAB — VITAMIN D 25 HYDROXY (VIT D DEFICIENCY, FRACTURES): VITD: 63.69 ng/mL (ref 30.00–100.00)

## 2019-12-03 LAB — BASIC METABOLIC PANEL
BUN: 19 mg/dL (ref 6–23)
CO2: 29 mEq/L (ref 19–32)
Calcium: 10.4 mg/dL (ref 8.4–10.5)
Chloride: 105 mEq/L (ref 96–112)
Creatinine, Ser: 1.2 mg/dL (ref 0.40–1.50)
GFR: 60.34 mL/min (ref >60.00)
Glucose, Bld: 125 mg/dL — ABNORMAL HIGH (ref 70–99)
Potassium: 4.8 mEq/L (ref 3.5–5.1)
Sodium: 140 mEq/L (ref 135–145)

## 2019-12-03 LAB — PTH, INTACT AND CALCIUM
Calcium: 10.3 mg/dL (ref 8.6–10.3)
PTH: 76 pg/mL — ABNORMAL HIGH (ref 14–64)

## 2019-12-03 LAB — TSH: TSH: 2.11 u[IU]/mL (ref 0.35–4.50)

## 2020-04-22 ENCOUNTER — Observation Stay (HOSPITAL_BASED_OUTPATIENT_CLINIC_OR_DEPARTMENT_OTHER): Payer: Medicare Other

## 2020-04-22 ENCOUNTER — Observation Stay (HOSPITAL_COMMUNITY)
Admission: EM | Admit: 2020-04-22 | Discharge: 2020-04-23 | Disposition: A | Discharge status: Home / Self Care | Payer: Medicare Other | Attending: Internal Medicine | Admitting: Internal Medicine

## 2020-04-22 ENCOUNTER — Other Ambulatory Visit: Payer: Self-pay

## 2020-04-22 ENCOUNTER — Encounter (HOSPITAL_COMMUNITY): Payer: Self-pay | Admitting: *Deleted

## 2020-04-22 ENCOUNTER — Emergency Department (HOSPITAL_COMMUNITY): Payer: Medicare Other

## 2020-04-22 DIAGNOSIS — R55 Syncope and collapse: Secondary | ICD-10-CM

## 2020-04-22 DIAGNOSIS — R030 Elevated blood-pressure reading, without diagnosis of hypertension: Secondary | ICD-10-CM | POA: Insufficient documentation

## 2020-04-22 DIAGNOSIS — U071 COVID-19: Secondary | ICD-10-CM | POA: Diagnosis not present

## 2020-04-22 DIAGNOSIS — I517 Cardiomegaly: Secondary | ICD-10-CM | POA: Insufficient documentation

## 2020-04-22 DIAGNOSIS — Z743 Need for continuous supervision: Secondary | ICD-10-CM | POA: Diagnosis not present

## 2020-04-22 DIAGNOSIS — I2510 Atherosclerotic heart disease of native coronary artery without angina pectoris: Secondary | ICD-10-CM | POA: Insufficient documentation

## 2020-04-22 DIAGNOSIS — Z87442 Personal history of urinary calculi: Secondary | ICD-10-CM | POA: Insufficient documentation

## 2020-04-22 DIAGNOSIS — R059 Cough, unspecified: Secondary | ICD-10-CM | POA: Diagnosis present

## 2020-04-22 DIAGNOSIS — R001 Bradycardia, unspecified: Secondary | ICD-10-CM | POA: Insufficient documentation

## 2020-04-22 DIAGNOSIS — R002 Palpitations: Secondary | ICD-10-CM | POA: Diagnosis present

## 2020-04-22 DIAGNOSIS — I499 Cardiac arrhythmia, unspecified: Secondary | ICD-10-CM | POA: Diagnosis not present

## 2020-04-22 DIAGNOSIS — R0981 Nasal congestion: Secondary | ICD-10-CM | POA: Diagnosis present

## 2020-04-22 DIAGNOSIS — R7989 Other specified abnormal findings of blood chemistry: Secondary | ICD-10-CM

## 2020-04-22 DIAGNOSIS — J029 Acute pharyngitis, unspecified: Secondary | ICD-10-CM | POA: Diagnosis present

## 2020-04-22 DIAGNOSIS — I493 Ventricular premature depolarization: Secondary | ICD-10-CM | POA: Diagnosis not present

## 2020-04-22 DIAGNOSIS — W19XXXA Unspecified fall, initial encounter: Secondary | ICD-10-CM | POA: Diagnosis not present

## 2020-04-22 DIAGNOSIS — E213 Hyperparathyroidism, unspecified: Secondary | ICD-10-CM | POA: Diagnosis not present

## 2020-04-22 DIAGNOSIS — R404 Transient alteration of awareness: Secondary | ICD-10-CM | POA: Diagnosis not present

## 2020-04-22 DIAGNOSIS — E860 Dehydration: Secondary | ICD-10-CM | POA: Insufficient documentation

## 2020-04-22 DIAGNOSIS — R42 Dizziness and giddiness: Secondary | ICD-10-CM | POA: Diagnosis not present

## 2020-04-22 DIAGNOSIS — R079 Chest pain, unspecified: Secondary | ICD-10-CM | POA: Diagnosis present

## 2020-04-22 LAB — FIBRINOGEN: Fibrinogen: 370 mg/dL (ref 210–475)

## 2020-04-22 LAB — FERRITIN: Ferritin: 283 ng/mL (ref 24–336)

## 2020-04-22 LAB — CBC WITH DIFFERENTIAL/PLATELET
Abs Immature Granulocytes: 0.01 10*3/uL (ref 0.00–0.07)
Basophils Absolute: 0 10*3/uL (ref 0.0–0.1)
Basophils Relative: 1 %
Eosinophils Absolute: 0 10*3/uL (ref 0.0–0.5)
Eosinophils Relative: 0 %
HCT: 44.8 % (ref 39.0–52.0)
Hemoglobin: 15 g/dL (ref 13.0–17.0)
Immature Granulocytes: 0 %
Lymphocytes Relative: 11 %
Lymphs Abs: 0.7 10*3/uL (ref 0.7–4.0)
MCH: 30.3 pg (ref 26.0–34.0)
MCHC: 33.5 g/dL (ref 30.0–36.0)
MCV: 90.5 fL (ref 80.0–100.0)
Monocytes Absolute: 0.8 10*3/uL (ref 0.1–1.0)
Monocytes Relative: 12 %
Neutro Abs: 4.9 10*3/uL (ref 1.7–7.7)
Neutrophils Relative %: 76 %
Platelets: 168 10*3/uL (ref 150–400)
RBC: 4.95 MIL/uL (ref 4.22–5.81)
RDW: 13.7 % (ref 11.5–15.5)
WBC: 6.5 10*3/uL (ref 4.0–10.5)
nRBC: 0 % (ref 0.0–0.2)

## 2020-04-22 LAB — BASIC METABOLIC PANEL
Anion gap: 8 (ref 5–15)
BUN: 19 mg/dL (ref 8–23)
CO2: 26 mmol/L (ref 22–32)
Calcium: 9.8 mg/dL (ref 8.9–10.3)
Chloride: 107 mmol/L (ref 98–111)
Creatinine, Ser: 1.12 mg/dL (ref 0.61–1.24)
GFR, Estimated: >60 mL/min (ref >60)
Glucose, Bld: 106 mg/dL — ABNORMAL HIGH (ref 70–99)
Potassium: 4.2 mmol/L (ref 3.5–5.1)
Sodium: 141 mmol/L (ref 135–145)

## 2020-04-22 LAB — D-DIMER, QUANTITATIVE: D-Dimer, Quant: 0.45 ug/mL-FEU (ref 0.00–0.50)

## 2020-04-22 LAB — ECHOCARDIOGRAM COMPLETE
Area-P 1/2: 4.06 cm2
Height: 70 in
S' Lateral: 2.7 cm
Weight: 2480 oz

## 2020-04-22 LAB — TROPONIN I (HIGH SENSITIVITY)
Troponin I (High Sensitivity): 4 ng/L (ref <18)
Troponin I (High Sensitivity): 4 ng/L (ref <18)

## 2020-04-22 LAB — SARS CORONAVIRUS 2 (TAT 6-24 HRS): SARS Coronavirus 2: POSITIVE — AB

## 2020-04-22 LAB — HIV ANTIBODY (ROUTINE TESTING W REFLEX): HIV Screen 4th Generation wRfx: NONREACTIVE

## 2020-04-22 LAB — MAGNESIUM: Magnesium: 2 mg/dL (ref 1.7–2.4)

## 2020-04-22 LAB — HEPATITIS B SURFACE ANTIGEN: Hepatitis B Surface Ag: NONREACTIVE

## 2020-04-22 LAB — BRAIN NATRIURETIC PEPTIDE: B Natriuretic Peptide: 23.7 pg/mL (ref 0.0–100.0)

## 2020-04-22 LAB — TSH: TSH: 4.333 u[IU]/mL (ref 0.350–4.500)

## 2020-04-22 LAB — T4, FREE: Free T4: 0.85 ng/dL (ref 0.61–1.12)

## 2020-04-22 LAB — LACTATE DEHYDROGENASE: LDH: 155 U/L (ref 98–192)

## 2020-04-22 LAB — PROCALCITONIN: Procalcitonin: <0.1 ng/mL

## 2020-04-22 LAB — C-REACTIVE PROTEIN: CRP: 0.6 mg/dL (ref <1.0)

## 2020-04-22 MED ORDER — ZINC SULFATE 220 (50 ZN) MG PO CAPS
220.0000 mg | ORAL_CAPSULE | Freq: Every day | ORAL | Status: DC
  Administered 2020-04-22 – 2020-04-23 (×2): 220 mg via ORAL
  Filled 2020-04-22 (×2): qty 1

## 2020-04-22 MED ORDER — ACETAMINOPHEN 325 MG PO TABS
650.0000 mg | ORAL_TABLET | Freq: Four times a day (QID) | ORAL | Status: DC | PRN
  Administered 2020-04-23: 650 mg via ORAL
  Filled 2020-04-22: qty 2

## 2020-04-22 MED ORDER — VITAMIN D 25 MCG (1000 UNIT) PO TABS
6000.0000 [IU] | ORAL_TABLET | Freq: Every day | ORAL | Status: DC
  Administered 2020-04-22 – 2020-04-23 (×2): 6000 [IU] via ORAL
  Filled 2020-04-22 (×3): qty 6

## 2020-04-22 MED ORDER — ACETAMINOPHEN 650 MG RE SUPP: 650.0000 mg | Freq: Four times a day (QID) | RECTAL | Status: DC | PRN

## 2020-04-22 MED ORDER — ENOXAPARIN SODIUM 40 MG/0.4ML ~~LOC~~ SOLN
40.0000 mg | SUBCUTANEOUS | Status: DC
  Administered 2020-04-23: 40 mg via SUBCUTANEOUS
  Filled 2020-04-22: qty 0.4

## 2020-04-22 MED ORDER — ASCORBIC ACID 500 MG PO TABS
500.0000 mg | ORAL_TABLET | Freq: Every day | ORAL | Status: DC
  Administered 2020-04-22 – 2020-04-23 (×2): 500 mg via ORAL
  Filled 2020-04-22 (×2): qty 1

## 2020-04-22 MED ORDER — SODIUM CHLORIDE 0.9% FLUSH
3.0000 mL | Freq: Two times a day (BID) | INTRAVENOUS | Status: DC
  Administered 2020-04-22 – 2020-04-23 (×2): 3 mL via INTRAVENOUS

## 2020-04-22 MED ORDER — ALBUTEROL SULFATE (2.5 MG/3ML) 0.083% IN NEBU: 2.5000 mg | INHALATION_SOLUTION | Freq: Four times a day (QID) | RESPIRATORY_TRACT | Status: DC | PRN

## 2020-04-22 MED ORDER — GUAIFENESIN-DM 100-10 MG/5ML PO SYRP: 10.0000 mL | ORAL_SOLUTION | ORAL | Status: DC | PRN

## 2020-04-22 MED ORDER — ALBUTEROL SULFATE HFA 108 (90 BASE) MCG/ACT IN AERS: 2.0000 | INHALATION_SPRAY | Freq: Four times a day (QID) | RESPIRATORY_TRACT | Status: DC | PRN

## 2020-04-22 MED ORDER — PHENOL 1.4 % MT LIQD
1.0000 | OROMUCOSAL | Status: DC | PRN
  Administered 2020-04-22: 1 via OROMUCOSAL
  Filled 2020-04-22 (×2): qty 177

## 2020-04-22 MED ORDER — ALBUTEROL SULFATE HFA 108 (90 BASE) MCG/ACT IN AERS: 2.0000 | INHALATION_SPRAY | Freq: Four times a day (QID) | RESPIRATORY_TRACT | Status: DC

## 2020-04-22 NOTE — Progress Notes (Signed)
  Echocardiogram 2D Echocardiogram with 3D has been performed.  Leta Jungling M 04/22/2020, 1:22 PM

## 2020-04-22 NOTE — ED Notes (Signed)
Attempted to call report

## 2020-04-22 NOTE — ED Triage Notes (Signed)
Patient presents to ed via [facility] states he woke up had a squeezing type pain and felt like he was going to pass out, states 2 years ago this happened and he would get up and walk around belch and he would feel better. Tonight he got up and passed out. Ems witnessed syncope and states his heart rate dropped to  45 for approx. 2 mins. Marland Kitchen Upon arrival patient is alert and oriented. Denies pain states he feels fine at present.

## 2020-04-22 NOTE — Plan of Care (Signed)
  Problem: Activity: Goal: Risk for activity intolerance will decrease Outcome: Progressing   Problem: Clinical Measurements: Goal: Respiratory complications will improve Outcome: Progressing   Problem: Clinical Measurements: Goal: Cardiovascular complication will be avoided Outcome: Progressing   Problem: Respiratory: Goal: Will maintain a patent airway Outcome: Progressing Goal: Complications related to the disease process, condition or treatment will be avoided or minimized Outcome: Progressing   Problem: Education: Goal: Knowledge of risk factors and measures for prevention of condition will improve Outcome: Progressing

## 2020-04-22 NOTE — Consult Note (Addendum)
Cardiology Consultation:   Patient ID: John Bryant MRN: [id number]; DOB: 1950-07-22  Admit date: 04/22/2020 Date of Consult: 04/22/2020  Primary Care Provider: [name], MD [facility] Cardiologist: [name], MD  [facility] Electrophysiologist:  None    Patient Profile:   John Bryant is a 70 y.o. male with a PMH of parathyroid abnormality and kidney stones, who is being seen today for the evaluation of syncope at the request of Dr. Wilkie Aye.  History of Present Illness:   John Bryant was in his usual state of health until a few days ago when he developed a sore throat and a mild headache. Yesterday he noticed some back discomfort. Today he woke up with some chest discomfort. He reported something didn't feel right so he urinated and quickly returned to bed. He checked his HR on a home O2 monitor and stated his HR dropped from 40s-30s. He felt like something was gently squeezing his heart slowing it down. In the past when he has had PVCs he gets up, walks around, and belches. He attempted to do this thinking it may help with his symptoms and after a couple steps out of bed he woke up on the floor. He thinks he lost consciousness for a few seconds. His significant other activated EMS. He was able to ambulate to the couch. On EMS arrival he was noted to have HR in the 30s which quickly improved to 60s. He reports he has felt fine since arrival to the ED.   He was last evaluated by cardiology at an outpatient visit with Lizabeth Leyden, NP 08/2017 at which time he described similar hand grabbing his heart sensations as well as SOB when climbing a flight of stairs. He had recently undergone a NST 05/2017 to follow-up an abnormal ETT with only 25% mLAD and ostial LM stenosis noted. He had a holter monitor as well which showed frequent PVCs with bigeminy and couplets. He was recommended to take prn metoprolol for palpitations at that time and follow-up as needed.   At the  time of this evaluation he is feeling back to his usual self. He has no complaints of chest pain at rest or with exertion. He reports palpitations usually occur for a second and resolve. No DOE. He denies orthopnea, PND, or LE edema. He denies prior syncopal episode.   Hospital course: BP generally above goal, otherwise VSS. Labs notable for electrolytes wnl, Cr 1.12, CBC wnl, HsTrop 4 x2, COVID-19 positive, Ddimer negative, TSH wnl. EKG with sinus rhythm with rate 79 bpm, ventricular trigeminy, otherwise no STE/D. Echo showed EF 55-60%, normal LV diastolic function, no RWMA, mild LVH, and no significant valvular abnormalities.       Past Medical History:  Diagnosis Date  . Hypercalcemia   . Kidney stones   . Parathyroid abnormality [facility])     Past Surgical History:  Procedure Laterality Date  . LEFT HEART CATH AND CORONARY ANGIOGRAPHY N/A 05/24/2017   Procedure: LEFT HEART CATH AND CORONARY ANGIOGRAPHY;  Surgeon: [place], Peter M, MD;  Location: [facility] INVASIVE CV LAB;  Service: Cardiovascular;  Laterality: N/A;     Home Medications:  Prior to Admission medications   Not on File    Inpatient Medications: Scheduled Meds: . vitamin C  500 mg Oral Daily  . cholecalciferol  6,000 Units Oral Daily  . enoxaparin (LOVENOX) injection  40 mg Subcutaneous Q24H  . sodium chloride flush  3 mL Intravenous Q12H  . zinc sulfate  220 mg Oral  Daily   Continuous Infusions:  PRN Meds: acetaminophen **OR** acetaminophen, albuterol, guaiFENesin-dextromethorphan, phenol  Allergies:    Allergies  Allergen Reactions  . No Known Allergies     Social History:   Social History   Socioeconomic History  . Marital status: Divorced    Spouse name: Not on file  . Number of children: Not on file  . Years of education: Not on file  . Highest education level: Not on file  Occupational History  . Not on file  Tobacco Use  . Smoking status: Never Smoker  . Smokeless tobacco: Never Used  Substance and  Sexual Activity  . Alcohol use: Yes    Comment: Rare  . Drug use: No  . Sexual activity: Not on file  Other Topics Concern  . Not on file  Social History Narrative  . Not on file   Social Determinants of Health   Financial Resource Strain: Not on file  Food Insecurity: Not on file  Transportation Needs: Not on file  Physical Activity: Not on file  Stress: Not on file  Social Connections: Not on file  Intimate Partner Violence: Not on file    Family History:    Family History  Problem Relation Age of Onset  . Dementia Mother   . Lung cancer Father   . Hyperparathyroidism Neg Hx      ROS:  Please see the history of present illness.   All other ROS reviewed and negative.     Physical Exam/Data:   Vitals:   04/22/20 1200 04/22/20 1600 04/22/20 1618 04/22/20 1648  BP: (!) 132/95  (!) 149/95 (!) 155/98  Pulse: 72 74 69 72  Resp: 18 17 20 18   Temp:   98 F (36.7 C) 98.3 F (36.8 C)  TempSrc:   Oral Oral  SpO2: 99% 99% 99% 99%  Weight:    68.5 kg  Height:    5\' 10"  (1.778 m)   No intake or output data in the 24 hours ending 04/22/20 1726 Last 3 Weights 04/22/2020 04/22/2020 12/02/2019  Weight (lbs) 151 lb 155 lb 157 lb 6.4 oz  Weight (kg) 68.493 kg 70.308 kg 71.396 kg     Body mass index is 21.67 kg/m.  General: in NAD Cardiac: no LE edema Pulm: speaking in full sentences without SOB Psych: normal affect  EKG:  The EKG was personally reviewed and demonstrates:  sinus rhythm with rate 79 bpm, ventricular trigeminy, otherwise no STE/D.  Relevant CV Studies: Holter monitor 2019: Sinus rhythm  Frequent Premature ventricular contractions (5455 during monitoring period) with bigeminy and couplets. He has several fusion beats but no ventricular tachycardia.  Rare premature atrial contractions. (21 during monitoring period).   Recent cath with mild CAD and normal LV function. Consider addition of a beta blocker for suppression of PVCs.    LHC 2019:  Mid LAD  lesion is 25% stenosed.  Ost LM lesion is 25% stenosed.  The left ventricular systolic function is normal.  LV end diastolic pressure is normal.  The left ventricular ejection fraction is 55-65% by visual estimate.   1. No significant CAD 2. Normal LV function 3. Normal LVEDP  Plan: It appears his ETT is falsely abnormal. Recommend risk factor modification.  Echocardiogram 04/22/20: 1. Left ventricular ejection fraction, by estimation, is 55 to 60%. The  left ventricle has normal function. The left ventricle has no regional  wall motion abnormalities. There is mild left ventricular hypertrophy.  Left ventricular diastolic parameters  were normal.  2. Right ventricular systolic function is normal. The right ventricular  size is normal. Tricuspid regurgitation signal is inadequate for assessing  PA pressure.  3. The mitral valve is normal in structure. Trivial mitral valve  regurgitation.  4. The aortic valve is tricuspid. Aortic valve regurgitation is not  visualized. No aortic stenosis is present.  5. The inferior vena cava is normal in size with greater than 50%  respiratory variability, suggesting right atrial pressure of 3 mmHg.    Laboratory Data:  High Sensitivity Troponin:   Recent Labs  Lab 04/22/20 0720 04/22/20 0957  TROPONINIHS 4 4     Chemistry Recent Labs  Lab 04/22/20 0720  NA 141  K 4.2  CL 107  CO2 26  GLUCOSE 106*  BUN 19  CREATININE 1.12  CALCIUM 9.8  GFRNONAA >60  ANIONGAP 8    No results for input(s): PROT, ALBUMIN, AST, ALT, ALKPHOS, BILITOT in the last 168 hours. Hematology Recent Labs  Lab 04/22/20 0720  WBC 6.5  RBC 4.95  HGB 15.0  HCT 44.8  MCV 90.5  MCH 30.3  MCHC 33.5  RDW 13.7  PLT 168   BNPNo results for input(s): BNP, PROBNP in the last 168 hours.  DDimer  Recent Labs  Lab 04/22/20 1240  DDIMER 0.45     Radiology/Studies:  DG Chest Portable 1 View  Result Date: 04/22/2020 CLINICAL DATA:  Syncope.  EXAM: PORTABLE CHEST 1 VIEW COMPARISON:  No prior. FINDINGS: Mediastinum hilar structures normal. No pulmonary infiltrates. Tiny rounded lucency noted over the scapula/left mid lung. This could represent a tiny benign scapular cyst or tiny pneumatocele. No pleural effusion or pneumothorax. Heart size normal. Mild thoracic spine scoliosis. IMPRESSION: No acute cardiopulmonary disease. Electronically Signed   By: Maisie Fushomas  Register   On: 04/22/2020 07:35   ECHOCARDIOGRAM COMPLETE  Result Date: 04/22/2020    ECHOCARDIOGRAM REPORT   Patient Name:   [facility]IS [facility] Date of Exam: 04/22/2020 Medical Rec #:  [id number]         Height:       70.0 in Accession #:    [id number]        Weight:       155.0 lb Date of Birth:  1951/03/21         BSA:          1.873 m Patient Age:    69 years          BP:           132/95 mmHg Patient Gender: M                 HR:           70 bpm. Exam Location:  Inpatient Procedure: 2D Echo, 3D Echo, Strain Analysis, Cardiac Doppler and Color Doppler Indications:    Syncope 780.2 / R55  History:        Patient has no prior history of Echocardiogram examinations.                 Arrythmias:PVC. Dehydration.  Sonographer:    Leta Junglingiffany Cooper RDCS Referring Phys: [id number] RONDELL A SMITH IMPRESSIONS  1. Left ventricular ejection fraction, by estimation, is 55 to 60%. The left ventricle has normal function. The left ventricle has no regional wall motion abnormalities. There is mild left ventricular hypertrophy. Left ventricular diastolic parameters were normal.  2. Right ventricular systolic function is normal. The right ventricular size is normal. Tricuspid regurgitation signal is inadequate for assessing PA  pressure.  3. The mitral valve is normal in structure. Trivial mitral valve regurgitation.  4. The aortic valve is tricuspid. Aortic valve regurgitation is not visualized. No aortic stenosis is present.  5. The inferior vena cava is normal in size with greater than 50% respiratory variability,  suggesting right atrial pressure of 3 mmHg. FINDINGS  Left Ventricle: Left ventricular ejection fraction, by estimation, is 55 to 60%. The left ventricle has normal function. The left ventricle has no regional wall motion abnormalities. The left ventricular internal cavity size was normal in size. There is  mild left ventricular hypertrophy. Left ventricular diastolic parameters were normal. Right Ventricle: The right ventricular size is normal. Right vetricular wall thickness was not assessed. Right ventricular systolic function is normal. Tricuspid regurgitation signal is inadequate for assessing PA pressure. Left Atrium: Left atrial size was normal in size. Right Atrium: Right atrial size was normal in size. Pericardium: There is no evidence of pericardial effusion. Mitral Valve: The mitral valve is normal in structure. Trivial mitral valve regurgitation. Tricuspid Valve: The tricuspid valve is normal in structure. Tricuspid valve regurgitation is trivial. Aortic Valve: The aortic valve is tricuspid. Aortic valve regurgitation is not visualized. No aortic stenosis is present. Pulmonic Valve: The pulmonic valve was not well visualized. Pulmonic valve regurgitation is not visualized. Aorta: The aortic root and ascending aorta are structurally normal, with no evidence of dilitation. Venous: The inferior vena cava is normal in size with greater than 50% respiratory variability, suggesting right atrial pressure of 3 mmHg. IAS/Shunts: The interatrial septum was not well visualized.  LEFT VENTRICLE PLAX 2D LVIDd:         3.80 cm  Diastology LVIDs:         2.70 cm  LV e' medial:    6.96 cm/s LV PW:         0.80 cm  LV E/e' medial:  11.4 LV IVS:        1.20 cm  LV e' lateral:   8.59 cm/s LVOT diam:     1.90 cm  LV E/e' lateral: 9.2 LV SV:         51 LV SV Index:   27 LVOT Area:     2.84 cm  RIGHT VENTRICLE RV S prime:     12.60 cm/s TAPSE (M-mode): 2.2 cm LEFT ATRIUM             Index       RIGHT ATRIUM          Index LA  diam:        2.90 cm 1.55 cm/m  RA Area:     9.19 cm LA Vol (A2C):   29.3 ml 15.64 ml/m RA Volume:   16.50 ml 8.81 ml/m LA Vol (A4C):   24.0 ml 12.81 ml/m LA Biplane Vol: 25.6 ml 13.67 ml/m  AORTIC VALVE LVOT Vmax:   93.30 cm/s LVOT Vmean:  66.000 cm/s LVOT VTI:    0.179 m  AORTA Ao Root diam: 3.30 cm Ao Asc diam:  2.80 cm MITRAL VALVE MV Area (PHT): 4.06 cm    SHUNTS MV Decel Time: 187 msec    Systemic VTI:  0.18 m MV E velocity: 79.30 cm/s  Systemic Diam: 1.90 cm MV A velocity: 79.70 cm/s MV E/A ratio:  0.99 Epifanio Lesches MD Electronically signed by Epifanio Lesches MD Signature Date/Time: 04/22/2020/1:43:55 PM    Final      Assessment and Plan:   1. Syncope: patient woke up feeling poorly this  morning. He noticed vague chest discomfort with a sensation of someone squeezing his heart and slowing the HR down. Syncope occurred shortly after getting out of bed and he regained consciousness within seconds. He noticed his HR decrease from 40s-30s on home O2 monitor prior to getting out of bed. On EMS arrival he was noted to have HR in the 30s at which time he had recurrent lightheadedness but no syncope. EMS run sheet reviewed an rhythm strips appear to be junctional rhythm with HR in the 30s. Possible this is 2/2 vagal response though could consider symptomatic bradycardia. He is not on any AV nodal blocking agents. Echo and EKG were reassuring. HsTrop negative x2.  - Will monitor on telemetry overnight - Anticipate EP evaluation tomorrow  - We discussed avoiding driving for 6 months given unexplained syncope.  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 He has not been on any medications since that time - Will check FLP in AM to determine need for statin  3. Elevated blood pressure without diagnosis of HTN: BP has been persistently elevated this admission.  - Continue to monitor while admitted   4. COVID-19: tested  positive this admission. Has had sore throat, headache, and upper back pain. He is unvaccinated. - Continue management per primary team   For questions or updates, please contact [facility] Please consult [email or web address] for contact info under   Signed, Beatriz StallionKrista M. Kroeger, PA-C  04/22/2020 5:26 PM As above, patient seen virtually due to being COVID-positive.  Briefly he is a 70 year old male for evaluation of syncope.  Patient is very active.  He typically does not have dyspnea on exertion, orthopnea, PND, pedal edema.  He does have a history of PVCs and states when they start he walks and belches and his symptoms resolved.  Patient states that for the past 2 to 3 days he has had a headache, backache and sore throat.  He awoke this morning and felt an uncomfortable feeling in his chest.  He went and urinated and with walking towards his bed he felt as though he may have syncope.  He laid down.  He then stood back up and had frank syncope for several seconds.  No preceding chest pain, dyspnea or palpitations.  EMS was called and patient felt better.  However after he was hooked to telemetry while sitting on the couch he again developed dizziness.  No preceding nausea, chest pain, palpitations or diaphoresis.  He felt as though he may have another syncopal episode.  He was laid back and his symptoms improved.  However rhythm strip showed junctional rhythm in the 30s.  He has been admitted and cardiology asked to evaluate.  Echocardiogram shows normal LV function.  Follow-up electrocardiogram shows sinus rhythm with no ST changes.  Note patient is COVID-positive.  1 syncope-initial event was potentially micturition syncope versus orthostatic mediated.  However he had a subsequent event associated with heart rate in the 30s and a junctional rhythm with no preceding symptoms.  Question sinus node dysfunction.  However electrocardiogram shows no conduction abnormalities.  LV function is normal.  We will follow  on telemetry.  I will ask electrophysiology to review tomorrow.  Patient instructed not to drive for 6 months.  2 COVID infection-Per primary care.  3 elevated blood pressure-we will follow and add medications as needed.  Olga MillersBrian Crenshaw, MD

## 2020-04-22 NOTE — H&P (Addendum)
History and Physical    John Bryant HEN:[id number] DOB: 1951/01/09 DOA: 04/22/2020  Referring MD/NP/PA: Ross Marcus, MD PCP: [name], MD  Consultants: Endocrinology Romero Belling, MD Patient coming from: Home via EMS  Chief Complaint: Passed out  I have personally briefly reviewed patient's old medical records in [facility]   HPI: John Bryant is a 69 y.o. male with medical history significant of hyperparathyroidism and PVCs presents after having a syncopal episode at home. He has a history of PVCs for several years. He reports normally being able to get up, walk around, and belch which seems like it would make it resolved. However, today he felt some chest discomfort and had checked his pulse oximetry which revealed that his heart rates were dropping from 50s to 40s before the reader cut off. Patient reported feeling as though he may pass out and had tried to get up and walk around as this normally helped in the past with the palpitation last 2-3 days patient reports that he has had a headache, sore throat, intermittent cough, and pains in between his shoulder blades. 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 He has not received any of the COVID-19 vaccines.   He was evaluated back in 2019 by Dr. Clifton James of cardiology back in 2019 and had undergone left heart cath which showed no signs of coronary artery disease LV function at that time. It appears he had been recommended to be started on metoprolol as needed, but patient never did.  In route with EMS patient was noted to have another syncopal episode with heart rates around 34 suspected to be some kind of sinus arrhythmia.  ED Course: Upon admission into the emergency department patient was seen to be afebrile with vital signs relatively stable.  Labs were unremarkable including CBC, BMP, and troponin.  Chest x-ray showed no  acute abnormalities.  ECG monitoring found the patient to be in ventricular trigeminy. COVID-19 screening was positive.  Review of Systems  Constitutional: Negative for fever.  HENT: Positive for congestion and sore throat.   Eyes: Negative for photophobia and pain.  Respiratory: Positive for cough. Negative for shortness of breath.   Cardiovascular: Positive for chest pain and palpitations. Negative for leg swelling.  Gastrointestinal: Negative for abdominal pain, diarrhea, nausea and vomiting.  Genitourinary: Negative for dysuria.  Musculoskeletal: Positive for back pain and falls.  Skin: Negative for rash.  Neurological: Positive for loss of consciousness and headaches. Negative for focal weakness.  Psychiatric/Behavioral: Negative for substance abuse. The patient is not nervous/anxious.     Past Medical History:  Diagnosis Date  . Hypercalcemia   . Kidney stones   . Parathyroid abnormality [facility])     Past Surgical History:  Procedure Laterality Date  . LEFT HEART CATH AND CORONARY ANGIOGRAPHY N/A 05/24/2017   Procedure: LEFT HEART CATH AND CORONARY ANGIOGRAPHY;  Surgeon: [place], Peter M, MD;  Location: [facility] INVASIVE CV LAB;  Service: Cardiovascular;  Laterality: N/A;     reports that he has never smoked. He has never used smokeless tobacco. He reports current alcohol use. He reports that he does not use drugs.  Allergies  Allergen Reactions  . No Known Allergies     Family History  Problem Relation Age of Onset  . Dementia Mother   . Lung cancer Father   . Hyperparathyroidism Neg Hx     Prior  to Admission medications   Not on File    Physical Exam:  Constitutional: Elderly male who appears in no acute distress at this time. Vitals:   04/22/20 0930 04/22/20 1037 04/22/20 1100 04/22/20 1130  BP: 137/76 133/80 122/77 (!) 145/80  Pulse: 69 75 63 70  Resp: 20 20 15    Temp:      SpO2: 97% 98% 96% 98%  Weight:      Height:       Eyes: PERRL, lids and conjunctivae  normal ENMT: Mucous membranes are moist. Mild posterior erythema. Neck: normal, supple, no masses, no thyromegaly Respiratory: clear to auscultation bilaterally, no wheezing, no crackles. Normal respiratory effort. No accessory muscle use.  Cardiovascular: Regular rate and rhythm, no murmurs / rubs / gallops. No extremity edema. 2+ pedal pulses. No carotid bruits.  Abdomen: no tenderness, no masses palpated. No hepatosplenomegaly. Bowel sounds positive.  Musculoskeletal: no clubbing / cyanosis. No joint deformity upper and lower extremities. Good ROM, no contractures. Normal muscle tone.  Skin: no rashes, lesions, ulcers. No induration Neurologic: CN 2-12 grossly intact. Sensation intact, DTR normal. Strength 5/5 in all 4.  Psychiatric: Normal judgment and insight. Alert and oriented x 3. Normal mood.     Labs on Admission: I have personally reviewed following labs and imaging studies  CBC: Recent Labs  Lab 04/22/20 0720  WBC 6.5  NEUTROABS 4.9  HGB 15.0  HCT 44.8  MCV 90.5  PLT 168   Basic Metabolic Panel: Recent Labs  Lab 04/22/20 0720  NA 141  K 4.2  CL 107  CO2 26  GLUCOSE 106*  BUN 19  CREATININE 1.12  CALCIUM 9.8  MG 2.0   GFR: Estimated Creatinine Clearance: 63.6 mL/min (by C-G formula based on SCr of 1.12 mg/dL). Liver Function Tests: No results for input(s): AST, ALT, ALKPHOS, BILITOT, PROT, ALBUMIN in the last 168 hours. No results for input(s): LIPASE, AMYLASE in the last 168 hours. No results for input(s): AMMONIA in the last 168 hours. Coagulation Profile: No results for input(s): INR, PROTIME in the last 168 hours. Cardiac Enzymes: No results for input(s): CKTOTAL, CKMB, CKMBINDEX, TROPONINI in the last 168 hours. BNP (last 3 results) No results for input(s): PROBNP in the last 8760 hours. HbA1C: No results for input(s): HGBA1C in the last 72 hours. CBG: No results for input(s): GLUCAP in the last 168 hours. Lipid Profile: No results for  input(s): CHOL, HDL, LDLCALC, TRIG, CHOLHDL, LDLDIRECT in the last 72 hours. Thyroid Function Tests: No results for input(s): TSH, T4TOTAL, FREET4, T3FREE, THYROIDAB in the last 72 hours. Anemia Panel: No results for input(s): VITAMINB12, FOLATE, FERRITIN, TIBC, IRON, RETICCTPCT in the last 72 hours. Urine analysis:    Component Value Date/Time   COLORURINE YELLOW 06/11/2015 1538   APPEARANCEUR CLEAR 06/11/2015 1538   LABSPEC 1.024 06/11/2015 1538   PHURINE 6.5 06/11/2015 1538   GLUCOSEU NEGATIVE 06/11/2015 1538   HGBUR MODERATE (A) 06/11/2015 1538   BILIRUBINUR NEGATIVE 06/11/2015 1538   KETONESUR 15 (A) 06/11/2015 1538   PROTEINUR 30 (A) 06/11/2015 1538   UROBILINOGEN 1.0 02/14/2009 0528   NITRITE NEGATIVE 06/11/2015 1538   LEUKOCYTESUR NEGATIVE 06/11/2015 1538   Sepsis Labs: No results found for this or any previous visit (from the past 240 hour(s)).   Radiological Exams on Admission: DG Chest Portable 1 View  Result Date: 04/22/2020 CLINICAL DATA:  Syncope. EXAM: PORTABLE CHEST 1 VIEW COMPARISON:  No prior. FINDINGS: Mediastinum hilar structures normal. No pulmonary infiltrates. Tiny rounded lucency  noted over the scapula/left mid lung. This could represent a tiny benign scapular cyst or tiny pneumatocele. No pleural effusion or pneumothorax. Heart size normal. Mild thoracic spine scoliosis. IMPRESSION: No acute cardiopulmonary disease. Electronically Signed   By: Maisie Fus  Register   On: 04/22/2020 07:35    EKG: Independently reviewed.  Sinus rhythm at 67 bpm with minimal ST elevation.  P EKG 79 bpm with ventricular trigeminy.  Assessment/Plan Syncope and collapse: Patient presents after having 2 syncopal episodes.  Reported to have heart rates into the 30s during transport.  Currently heart rates maintained 60- 70 bpm. -Admit to cardiac telemetry bed -Check orthostatic vital signs -Check D-dimer, TSH, and free T4 -Check echocardiogram -Appreciate cardiology consultative  services, we will follow-up for further recommendation  Sinus arrhythmia trigeminy: Patient has documented ECG strips showed heart rates in the 30s and then ECG tracings with patient in trigeminy. -Follow-up telemetry  COVID-19 infection: Acute. Patient reports complaints of headache, sore throat, intermittent cough, and some myalgias. That he did not receive any COVID-19 vaccinations, but had come in close contact with a coworker suspected to be positive. COVID-19 screening was positive. Chest x-ray showed no acute abnormalities and patient is currently able to maintain O2 saturations on room air. -Airborne precautions -Check inflammatory markers  -Albuterol inhaler as needed -Vitamin C and zinc -Continue to monitor for possible need of treatment  Hyperparathyroidism: Calcium levels within normal limits.  Managed in the outpatient setting by Dr. Everardo All and currently takes vitamin D3 6000 units daily -Continue outpatient treatment and follow-up  DVT prophylaxis: Lovenox Code Status: Full Family Communication: daughter updated over the phone Disposition Plan: Likely discharge home Consults called: Cardiology Admission status: Observation requiring overnight monitoring per heart rate in the setting of distinct  Clydie Braun MD [facility]   If 7PM-7AM, please contact night-coverage   04/22/2020, 12:05 PM

## 2020-04-22 NOTE — ED Provider Notes (Addendum)
[facility] EMERGENCY DEPARTMENT Provider Note   CSN: [id number] Arrival date & time: 04/22/20  5277     History Chief Complaint  Patient presents with  . Near Syncope    John Bryant is a 70 y.o. male.  HPI     This is a 70 year old male with history of parathyroid disease and kidney stones who presents with syncope.  Patient reports that he woke up this morning and felt "bad."  He states that he had a discomfort in his chest when he got up to go to the restroom.  He states that he also developed a sore throat yesterday.  No fevers or cough.  He has had a known sick contact but unsure whether that patient has COVID.  He is unvaccinated.  Patient states that he sat down until his wife that he did not feel well.  He subsequently "ended up on the floor."  He states that prior to passing out he put his oxygen monitor on and noted that his heart rate gradually declined from 60 to 50 to 40s.  He reportedly passed out.  Per EMS he passed out a second time.  Heart rate noted to be in the 30s.  He has a history of PVCs.  He states that he feels them every day but this felt different.  Currently he states he feels well.  He maybe has not had as much to drink in the last few days as he normally would.  Patient did have an ischemic evaluation in 2019.  Abnormal stress test but normal cardiac catheterization.  Holter monitoring revealed frequent PVCs and it was recommended he start metoprolol.  He did not start this medication and is currently not on any beta-blockade.  Past Medical History:  Diagnosis Date  . Hypercalcemia   . Kidney stones   . Parathyroid abnormality [facility])     Patient Active Problem List   Diagnosis Date Noted  . Arthralgia 06/04/2019  . Chest pain 05/24/2017  . Abnormal stress test 05/24/2017  . Hypercalcemia 01/11/2016    Past Surgical History:  Procedure Laterality Date  . LEFT HEART CATH AND CORONARY ANGIOGRAPHY N/A 05/24/2017   Procedure:  LEFT HEART CATH AND CORONARY ANGIOGRAPHY;  Surgeon: [place], Peter M, MD;  Location: [facility] INVASIVE CV LAB;  Service: Cardiovascular;  Laterality: N/A;       Family History  Problem Relation Age of Onset  . Dementia Mother   . Lung cancer Father   . Hyperparathyroidism Neg Hx     Social History   Tobacco Use  . Smoking status: Never Smoker  . Smokeless tobacco: Never Used  Substance Use Topics  . Alcohol use: Yes    Comment: Rare  . Drug use: No    Home Medications Prior to Admission medications   Not on File    Allergies    No known allergies  Review of Systems   Review of Systems  Constitutional: Negative for fever.  HENT: Positive for sore throat.   Respiratory: Negative for shortness of breath.   Cardiovascular: Negative for chest pain.  Gastrointestinal: Negative for abdominal pain, nausea and vomiting.  Genitourinary: Negative for dysuria.  Neurological: Positive for syncope.  All other systems reviewed and are negative.   Physical Exam Updated Vital Signs BP (!) 145/80   Pulse 70   Temp 97.7 F (36.5 C)   Resp 15   Ht 1.778 m (5\' 10" )   Wt 70.3 kg   SpO2 98%  BMI 22.24 kg/m   Physical Exam Vitals and nursing note reviewed.  Constitutional:      Appearance: He is well-developed and well-nourished. He is not ill-appearing.  HENT:     Head: Normocephalic and atraumatic.     Nose: Nose normal.     Mouth/Throat:     Mouth: Mucous membranes are moist.  Eyes:     Pupils: Pupils are equal, round, and reactive to light.  Cardiovascular:     Rate and Rhythm: Normal rate and regular rhythm.     Heart sounds: Normal heart sounds. No murmur heard.     Comments: Frequent PVCs Pulmonary:     Effort: Pulmonary effort is normal. No respiratory distress.     Breath sounds: Normal breath sounds. No wheezing.  Abdominal:     General: Bowel sounds are normal.     Palpations: Abdomen is soft.     Tenderness: There is no abdominal tenderness. There is no  rebound.  Musculoskeletal:        General: No tenderness or edema.     Cervical back: Neck supple.     Right lower leg: No edema.     Left lower leg: No edema.  Lymphadenopathy:     Cervical: No cervical adenopathy.  Skin:    General: Skin is warm and dry.  Neurological:     Mental Status: He is alert and oriented to person, place, and time.  Psychiatric:        Mood and Affect: Mood and affect and mood normal.     ED Results / Procedures / Treatments   Labs (all labs ordered are listed, but only abnormal results are displayed) Labs Reviewed  BASIC METABOLIC PANEL - Abnormal; Notable for the following components:      Result Value   Glucose, Bld 106 (*)    All other components within normal limits  SARS CORONAVIRUS 2 (TAT 6-24 HRS)  CBC WITH DIFFERENTIAL/PLATELET  MAGNESIUM  TROPONIN I (HIGH SENSITIVITY)  TROPONIN I (HIGH SENSITIVITY)    EKG EKG Interpretation  Date/Time:  Thursday April 22 2020 06:30:34 EST Ventricular Rate:  64 PR Interval:    QRS Duration: 96 QT Interval:  394 QTC Calculation: 407 R Axis:   73 Text Interpretation: Sinus rhythm Minimal ST elevation, anterior leads No previous ECGs available Confirmed by Zadie Rhine ([id number]) on 04/22/2020 6:37:01 AM   EKG Interpretation  Date/Time:  Thursday April 22 2020 08:00:44 EST Ventricular Rate:  79 PR Interval:    QRS Duration: 96 QT Interval:  385 QTC Calculation: 442 R Axis:   81 Text Interpretation: Sinus rhythm Ventricular trigeminy Biatrial enlargement Borderline right axis deviation Minimal ST elevation, anterior leads now in trigeminy Confirmed by Ross Marcus [phone]) on 04/22/2020 9:27:15 AM          Radiology DG Chest Portable 1 View  Result Date: 04/22/2020 CLINICAL DATA:  Syncope. EXAM: PORTABLE CHEST 1 VIEW COMPARISON:  No prior. FINDINGS: Mediastinum hilar structures normal. No pulmonary infiltrates. Tiny rounded lucency noted over the scapula/left mid lung. This could  represent a tiny benign scapular cyst or tiny pneumatocele. No pleural effusion or pneumothorax. Heart size normal. Mild thoracic spine scoliosis. IMPRESSION: No acute cardiopulmonary disease. Electronically Signed   By: Maisie Fus  Register   On: 04/22/2020 07:35    Procedures Procedures (including critical care time)  Medications Ordered in ED Medications - No data to display  ED Course  I have reviewed the triage vital signs and the nursing notes.  Pertinent  labs & imaging results that were available during my care of the patient were reviewed by me and considered in my medical decision making (see chart for details).  Clinical Course as of 04/22/20 1157  Thu Apr 22, 2020  1156 Spoke with cardiology.  They will evaluate patient.  Plan for admission to the hospitalist for cardiac monitoring. [CH]    Clinical Course User Index [CH] Kaeya Schiffer, Mayer Masker, MD   MDM Rules/Calculators/A&P                          Patient presents with syncopal episode from home.  Reports bradycardia and frequent PVCs.  On my evaluation he is asymptomatic.  Otherwise nontoxic-appearing.  Does report some recent sore throat.  We will send COVID testing given prevalence of COVID-19 in the community.  EKG shows no evidence of ischemia.  He did have a repeat EKG that showed trigeminy.  This is consistent with his history of PVCs.  Labs reviewed.  No significant metabolic derangements.  Magnesium normal.  Initial troponin negative.  Doubt primary ACS.  Chest x-ray without pneumothorax or pneumonia.  EMS strips reviewed and patient was heart rate in the 30s with unclear rhythm.  We will plan for cardiology consultation and admission to the hospital given documented bradycardia in the setting of 2 syncopal episodes.   Final Clinical Impression(s) / ED Diagnoses Final diagnoses:  Syncope, unspecified syncope type  Bradycardia    Rx / DC Orders ED Discharge Orders    None       Shon Baton, MD 04/22/20  1124    Shon Baton, MD 04/22/20 1157

## 2020-04-23 DIAGNOSIS — R55 Syncope and collapse: Secondary | ICD-10-CM | POA: Diagnosis not present

## 2020-04-23 DIAGNOSIS — U071 COVID-19: Secondary | ICD-10-CM | POA: Diagnosis not present

## 2020-04-23 LAB — BASIC METABOLIC PANEL
Anion gap: 9 (ref 5–15)
BUN: 22 mg/dL (ref 8–23)
CO2: 24 mmol/L (ref 22–32)
Calcium: 9.8 mg/dL (ref 8.9–10.3)
Chloride: 105 mmol/L (ref 98–111)
Creatinine, Ser: 1.16 mg/dL (ref 0.61–1.24)
GFR, Estimated: >60 mL/min (ref >60)
Glucose, Bld: 98 mg/dL (ref 70–99)
Potassium: 4.2 mmol/L (ref 3.5–5.1)
Sodium: 138 mmol/L (ref 135–145)

## 2020-04-23 LAB — CBC
HCT: 43.3 % (ref 39.0–52.0)
Hemoglobin: 14.7 g/dL (ref 13.0–17.0)
MCH: 30.4 pg (ref 26.0–34.0)
MCHC: 33.9 g/dL (ref 30.0–36.0)
MCV: 89.6 fL (ref 80.0–100.0)
Platelets: 171 10*3/uL (ref 150–400)
RBC: 4.83 MIL/uL (ref 4.22–5.81)
RDW: 13.6 % (ref 11.5–15.5)
WBC: 3.8 10*3/uL — ABNORMAL LOW (ref 4.0–10.5)
nRBC: 0 % (ref 0.0–0.2)

## 2020-04-23 LAB — LIPID PANEL
Cholesterol: 155 mg/dL (ref 0–200)
HDL: 34 mg/dL — ABNORMAL LOW (ref >40)
LDL Cholesterol: 81 mg/dL (ref 0–99)
Total CHOL/HDL Ratio: 4.6 RATIO
Triglycerides: 200 mg/dL — ABNORMAL HIGH (ref <150)
VLDL: 40 mg/dL (ref 0–40)

## 2020-04-23 LAB — MAGNESIUM: Magnesium: 1.9 mg/dL (ref 1.7–2.4)

## 2020-04-23 NOTE — Progress Notes (Signed)
I reinforced with patient the importance of not driving until cleared to do so by cardiology.

## 2020-04-23 NOTE — Progress Notes (Signed)
Progress Note  Due to the COVID-19 pandemic, this visit was completed with telemedicine (audio/video) technology to reduce patient and provider exposure as well as to preserve personal protective equipment.   Patient Name: John Bryant Date of Encounter: 04/23/2020  Primary Cardiologist: [name], MD   Subjective   No CP or dyspnea  Inpatient Medications    Scheduled Meds: . vitamin C  500 mg Oral Daily  . cholecalciferol  6,000 Units Oral Daily  . enoxaparin (LOVENOX) injection  40 mg Subcutaneous Q24H  . sodium chloride flush  3 mL Intravenous Q12H  . zinc sulfate  220 mg Oral Daily   Continuous Infusions:  PRN Meds: acetaminophen **OR** acetaminophen, albuterol, guaiFENesin-dextromethorphan, phenol   Vital Signs    Vitals:   04/22/20 1648 04/22/20 2013 04/23/20 0000 04/23/20 0449  BP: (!) 155/98 (!) 141/97 (!) 142/100 (!) 148/101  Pulse: 72 80 81 82  Resp: 18 18 15 18   Temp: 98.3 F (36.8 C) 98.3 F (36.8 C) 99 F (37.2 C) 98.4 F (36.9 C)  TempSrc: Oral Oral Oral Oral  SpO2: 99% 97% 98% 96%  Weight: 68.5 kg     Height: 5\' 10"  (1.778 m)      No intake or output data in the 24 hours ending 04/23/20 0752 Last 3 Weights 04/22/2020 04/22/2020 12/02/2019  Weight (lbs) 151 lb 155 lb 157 lb 6.4 oz  Weight (kg) 68.493 kg 70.308 kg 71.396 kg      Telemetry    Sinus with PVCs - Personally Reviewed  Physical Exam   VITAL SIGNS:  reviewed  Labs    Chemistry Recent Labs  Lab 04/22/20 0720 04/23/20 0145  NA 141 138  K 4.2 4.2  CL 107 105  CO2 26 24  GLUCOSE 106* 98  BUN 19 22  CREATININE 1.12 1.16  CALCIUM 9.8 9.8  GFRNONAA >60 >60  ANIONGAP 8 9     Hematology Recent Labs  Lab 04/22/20 0720 04/23/20 0145  WBC 6.5 3.8*  RBC 4.95 4.83  HGB 15.0 14.7  HCT 44.8 43.3  MCV 90.5 89.6  MCH 30.3 30.4  MCHC 33.5 33.9  RDW 13.7 13.6  PLT 168 171    Cardiac EnzymesNo results for input(s): TROPONINI in the last 168 hours. No  results for input(s): TROPIPOC in the last 168 hours.   BNP Recent Labs  Lab 04/22/20 1650  BNP 23.7     DDimer  Recent Labs  Lab 04/22/20 1240  DDIMER 0.45     Radiology    DG Chest Portable 1 View  Result Date: 04/22/2020 CLINICAL DATA:  Syncope. EXAM: PORTABLE CHEST 1 VIEW COMPARISON:  No prior. FINDINGS: Mediastinum hilar structures normal. No pulmonary infiltrates. Tiny rounded lucency noted over the scapula/left mid lung. This could represent a tiny benign scapular cyst or tiny pneumatocele. No pleural effusion or pneumothorax. Heart size normal. Mild thoracic spine scoliosis. IMPRESSION: No acute cardiopulmonary disease. Electronically Signed   By: 04/24/20  Register   On: 04/22/2020 07:35   ECHOCARDIOGRAM COMPLETE  Result Date: 04/22/2020    ECHOCARDIOGRAM REPORT   Patient Name:   ANGIE HOGG [facility] Date of Exam: 04/22/2020 Medical Rec #:  [facility]         Height:       70.0 in Accession #:    04/24/2020        Weight:       155.0 lb Date of Birth:  1951/02/27         BSA:  1.873 m Patient Age:    21 years          BP:           132/95 mmHg Patient Gender: M                 HR:           70 bpm. Exam Location:  Inpatient Procedure: 2D Echo, 3D Echo, Strain Analysis, Cardiac Doppler and Color Doppler Indications:    Syncope 780.2 / R55  History:        Patient has no prior history of Echocardiogram examinations.                 Arrythmias:PVC. Dehydration.  Sonographer:    Leta Jungling RDCS Referring Phys: [id number] RONDELL A SMITH IMPRESSIONS  1. Left ventricular ejection fraction, by estimation, is 55 to 60%. The left ventricle has normal function. The left ventricle has no regional wall motion abnormalities. There is mild left ventricular hypertrophy. Left ventricular diastolic parameters were normal.  2. Right ventricular systolic function is normal. The right ventricular size is normal. Tricuspid regurgitation signal is inadequate for assessing PA pressure.  3. The mitral  valve is normal in structure. Trivial mitral valve regurgitation.  4. The aortic valve is tricuspid. Aortic valve regurgitation is not visualized. No aortic stenosis is present.  5. The inferior vena cava is normal in size with greater than 50% respiratory variability, suggesting right atrial pressure of 3 mmHg. FINDINGS  Left Ventricle: Left ventricular ejection fraction, by estimation, is 55 to 60%. The left ventricle has normal function. The left ventricle has no regional wall motion abnormalities. The left ventricular internal cavity size was normal in size. There is  mild left ventricular hypertrophy. Left ventricular diastolic parameters were normal. Right Ventricle: The right ventricular size is normal. Right vetricular wall thickness was not assessed. Right ventricular systolic function is normal. Tricuspid regurgitation signal is inadequate for assessing PA pressure. Left Atrium: Left atrial size was normal in size. Right Atrium: Right atrial size was normal in size. Pericardium: There is no evidence of pericardial effusion. Mitral Valve: The mitral valve is normal in structure. Trivial mitral valve regurgitation. Tricuspid Valve: The tricuspid valve is normal in structure. Tricuspid valve regurgitation is trivial. Aortic Valve: The aortic valve is tricuspid. Aortic valve regurgitation is not visualized. No aortic stenosis is present. Pulmonic Valve: The pulmonic valve was not well visualized. Pulmonic valve regurgitation is not visualized. Aorta: The aortic root and ascending aorta are structurally normal, with no evidence of dilitation. Venous: The inferior vena cava is normal in size with greater than 50% respiratory variability, suggesting right atrial pressure of 3 mmHg. IAS/Shunts: The interatrial septum was not well visualized.  LEFT VENTRICLE PLAX 2D LVIDd:         3.80 cm  Diastology LVIDs:         2.70 cm  LV e' medial:    6.96 cm/s LV PW:         0.80 cm  LV E/e' medial:  11.4 LV IVS:        1.20  cm  LV e' lateral:   8.59 cm/s LVOT diam:     1.90 cm  LV E/e' lateral: 9.2 LV SV:         51 LV SV Index:   27 LVOT Area:     2.84 cm  RIGHT VENTRICLE RV S prime:     12.60 cm/s TAPSE (M-mode): 2.2 cm LEFT ATRIUM  Index       RIGHT ATRIUM          Index LA diam:        2.90 cm 1.55 cm/m  RA Area:     9.19 cm LA Vol (A2C):   29.3 ml 15.64 ml/m RA Volume:   16.50 ml 8.81 ml/m LA Vol (A4C):   24.0 ml 12.81 ml/m LA Biplane Vol: 25.6 ml 13.67 ml/m  AORTIC VALVE LVOT Vmax:   93.30 cm/s LVOT Vmean:  66.000 cm/s LVOT VTI:    0.179 m  AORTA Ao Root diam: 3.30 cm Ao Asc diam:  2.80 cm MITRAL VALVE MV Area (PHT): 4.06 cm    SHUNTS MV Decel Time: 187 msec    Systemic VTI:  0.18 m MV E velocity: 79.30 cm/s  Systemic Diam: 1.90 cm MV A velocity: 79.70 cm/s MV E/A ratio:  0.99 Epifanio Lesches MD Electronically signed by Epifanio Lesches MD Signature Date/Time: 04/22/2020/1:43:55 PM    Final     Patient Profile     70 year old male with syncope.  Echocardiogram shows normal LV function.  Follow-up electrocardiogram shows sinus rhythm with no ST changes.  Note patient is COVID-positive.   Assessment & Plan    1 syncope-as outlined previously history with initial event seems most consistent with micturition syncope with possible contribution from orthostasis.  However he had a subsequent event with no preceding symptoms and EMS telemetry recordings demonstrated junctional rhythm with heart rate in the 30s.  Question sinus node dysfunction.  LV function is normal.  Telemetry overnight shows sinus with PVCs.  We will ask electrophysiology to evaluate.  Patient previously instructed not to drive for 6 months following his event.   2 COVID infection-Per primary care.  3 elevated blood pressure-blood pressure elevated this morning.  We will follow as an outpatient and add medications as needed.  For questions or updates, please contact [facility] Please consult [email or web address] for contact  info under        Signed, Olga Millers, MD  04/23/2020, 7:52 AM

## 2020-04-23 NOTE — Consult Note (Signed)
Cardiology Consultation:   Patient ID: John Bryant MRN: [id number]; DOB: Jul 02, 1950  Admit date: 04/22/2020 Date of Consult: 04/23/2020  Primary Care Provider: [name], MD [facility] Cardiologist: Dr. Dala [facility] Electrophysiologist:  None    Patient Profile:   John Bryant is a 70 y.o. male with a hx of hyperparathyroidism, PVCs, renal calculi, who is being seen today for the evaluation of syncope and bradycardia at the request of Dr. Jens Bryant.  His PMD notes his hx mild hypercalcemia (dx'ed 2017; PTH was borderline high; he Bryant had urolithiasis since approx 2010; w/u of hypercalcemia Bryant otherwise been neg; 24-HR urine Ca++ was 324 mg; DEXA in 2019 was normal; he Bryant no h/o bony fractures; PTH scan showed activity at the right lower parathyroid area).   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 Mantione initially seen by Dr. Eden Bryant back in 2019 for CP that sounded atypical, planned for ETT, this was abnormal > LHC with NOD. He was subsequently planned to f/u with Dr. Clifton Bryant. He was seen by an APP with c/o feeling weird with and irregular heart beat noted by his BP monitor, though no sense of palpitations 48 monitor noted PVCs and metoprolol was added to his regime as an as needed plan the pt reluctant for a daily medication. Also discussed f/u with his primary/endocrinologist  Of late in the last couple days prior to admission mild symptoms of a sore throat, back and head ache and coworker with possible COIVD.    EMS record reviewed: Pt and family reported that he had not been feeling well, that day woke with a slight chest pressure,  family observed to full syncopal episodes the patient had no personal recollection of this, though family reported him as looking pale. With EMS the patient reported similar feeling of weakness and asked to lay down, he was placed on a monitor with rates 40's     In d/w the patient He mentions waking with an unusual and slight discomfort on his chest, had not had anything similar previously and felt a bit unwell.  He got up needing to use the bathroom, urinated and started to feel worse, grabbed his pulse ox and went to laid down, noted his O2 sats high 90s but HR was steadily dropping to the 40's and the pulse ox turned off.Historically when having OVCs he would get up and walk around usually belch and feel better, so he tried this, fairly immediately feeling weak and worse and by his friend's acciunt fainted, quickly woke though tried to get up and fainted again, and agin quickly woke. They called EMS While with them seated on the couch, started to feel weak again and asked to lay down,  EMS found him in a junctional rhythm in the 30's He laid down and immediately felt better  He Bryant felt well since  LABS K+ 4.2 > 4.2 BUN/Creat 19/1.12 > 22/1.16 BNP 23 HS Tro 4, 4WBC 3.8 H/H 14/43 Plts 171 TSH 4.333     Past Medical History:  Diagnosis Date  . Hypercalcemia   . Kidney stones   . Parathyroid abnormality [facility])     Past Surgical History:  Procedure Laterality Date  . LEFT HEART CATH AND CORONARY ANGIOGRAPHY N/A 05/24/2017   Procedure: LEFT HEART CATH AND CORONARY ANGIOGRAPHY;  Surgeon: Bryant, John M, MD;  Location: [facility] INVASIVE CV LAB;  Service: Cardiovascular;  Laterality: N/A;  Home Medications:  Prior to Admission medications   Not on File    Inpatient Medications: Scheduled Meds: . vitamin C  500 mg Oral Daily  . cholecalciferol  6,000 Units Oral Daily  . enoxaparin (LOVENOX) injection  40 mg Subcutaneous Q24H  . sodium chloride flush  3 mL Intravenous Q12H  . zinc sulfate  220 mg Oral Daily   Continuous Infusions:  PRN Meds: acetaminophen **OR** acetaminophen, albuterol, guaiFENesin-dextromethorphan, phenol  Allergies:    Allergies  Allergen Reactions  . No Known Allergies     Social History:   Social  History   Socioeconomic History  . Marital status: Divorced    Spouse name: Not on file  . Number of children: Not on file  . Years of education: Not on file  . Highest education level: Not on file  Occupational History  . Not on file  Tobacco Use  . Smoking status: Never Smoker  . Smokeless tobacco: Never Used  Substance and Sexual Activity  . Alcohol use: Yes    Comment: Rare  . Drug use: No  . Sexual activity: Not on file  Other Topics Concern  . Not on file  Social History Narrative  . Not on file   Social Determinants of Health   Financial Resource Strain: Not on file  Food Insecurity: Not on file  Transportation Needs: Not on file  Physical Activity: Not on file  Stress: Not on file  Social Connections: Not on file  Intimate Partner Violence: Not on file    Family History:   Family History  Problem Relation Age of Onset  . Dementia Mother   . Lung cancer Father   . Hyperparathyroidism Neg Hx      ROS:  Please see the history of present illness.  All other ROS reviewed and negative.     Physical Exam/Data:   Vitals:   04/22/20 1648 04/22/20 2013 04/23/20 0000 04/23/20 0449  BP: (!) 155/98 (!) 141/97 (!) 142/100 (!) 148/101  Pulse: 72 80 81 82  Resp: 18 18 15 18   Temp: 98.3 F (36.8 C) 98.3 F (36.8 C) 99 F (37.2 C) 98.4 F (36.9 C)  TempSrc: Oral Oral Oral Oral  SpO2: 99% 97% 98% 96%  Weight: 68.5 kg     Height: 5\' 10"  (1.778 m)      No intake or output data in the 24 hours ending 04/23/20 0903 Last 3 Weights 04/22/2020 04/22/2020 12/02/2019  Weight (lbs) 151 lb 155 lb 157 lb 6.4 oz  Weight (kg) 68.493 kg 70.308 kg 71.396 kg     Body mass index is 21.67 kg/m.  Given COVID + status Called the patient on the phone He sound well, does not sound SOB or in any distress, no coughing Speaks in full sentences and at a normal pace. Very pleasant   EKG:  The EKG was personally reviewed and demonstrates:    SR 64bpm, , no ST/T changes SR 62m PVCs  (trigeminal)  Old 05/04/17: SR 70   Telemetry:  Telemetry was personally reviewed and demonstrates:   SR 70's-100, intermittently Bryant PVCs    Relevant CV Studies: 04/22/2020: TTE IMPRESSIONS  1. Left ventricular ejection fraction, by estimation, is 55 to 60%. The  left ventricle Bryant normal function. The left ventricle Bryant no regional  wall motion abnormalities. There is mild left ventricular hypertrophy.  Left ventricular diastolic parameters  were normal.  2. Right ventricular systolic function is normal. The right ventricular  size is normal. Tricuspid  regurgitation signal is inadequate for assessing  PA pressure.  3. The mitral valve is normal in structure. Trivial mitral valve  regurgitation.  4. The aortic valve is tricuspid. Aortic valve regurgitation is not  visualized. No aortic stenosis is present.  5. The inferior vena cava is normal in size with greater than 50%  respiratory variability, suggesting right atrial pressure of 3 mmHg.    May 2019 48hr monitor Sinus rhythm  Frequent Premature ventricular contractions (5455 during monitoring period) with bigeminy and couplets. He Bryant several fusion beats but no ventricular tachycardia.  Rare premature atrial contractions. (21 during monitoring period).     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  Ost LM lesion is 25% stenosed.  The left ventricular systolic function is normal.  LV end diastolic pressure is normal.  The left ventricular ejection fraction is 55-65% by visual estimate.   1. No significant CAD 2. Normal LV function 3. Normal LVEDP  Plan: It appears his ETT is falsely abnormal. Recommend risk factor modification.   Laboratory Data:  High Sensitivity Troponin:   Recent Labs  Lab 04/22/20 0720 04/22/20 0957  TROPONINIHS 4 4     Chemistry Recent Labs  Lab 04/22/20 0720 04/23/20 0145  NA 141 138  K 4.2 4.2  CL 107 105  CO2 26 24  GLUCOSE 106* 98  BUN 19 22   CREATININE 1.12 1.16  CALCIUM 9.8 9.8  GFRNONAA >60 >60  ANIONGAP 8 9    No results for input(s): PROT, ALBUMIN, AST, ALT, ALKPHOS, BILITOT in the last 168 hours. Hematology Recent Labs  Lab 04/22/20 0720 04/23/20 0145  WBC 6.5 3.8*  RBC 4.95 4.83  HGB 15.0 14.7  HCT 44.8 43.3  MCV 90.5 89.6  MCH 30.3 30.4  MCHC 33.5 33.9  RDW 13.7 13.6  PLT 168 171   BNP Recent Labs  Lab 04/22/20 1650  BNP 23.7    DDimer  Recent Labs  Lab 04/22/20 1240  DDIMER 0.45     Radiology/Studies:  DG Chest Portable 1 View Result Date: 04/22/2020 CLINICAL DATA:  Syncope. EXAM: PORTABLE CHEST 1 VIEW COMPARISON:  No prior. FINDINGS: Mediastinum hilar structures normal. No pulmonary infiltrates. Tiny rounded lucency noted over the scapula/left mid lung. This could represent a tiny benign scapular cyst or tiny pneumatocele. No pleural effusion or pneumothorax. Heart size normal. Mild thoracic spine scoliosis. IMPRESSION: No acute cardiopulmonary disease. Electronically Signed   By: Maisie Fus  Register   On: 04/22/2020 07:35      Assessment and Plan:   1. Syncope     Demonstrated junctional bradycardia by EMS, stable BPs once taken     He is COVID +      He Bryant never fainted before, no family hx of sudden death  Despite not markedly symptomatic with his COVID, he is positive and suspect most likely vagally mediated Not felt to require pacing at this juncture  EP follow up will be arranged and will plan at that time to place ZIO AT monitor once recorved from his illness.  OK to discharge from EP perspective when medically ready otherwise   For questions or updates, please contact [facility] Please consult [email or web address] for contact info under    Signed, Sheilah Pigeon, PA-C  04/23/2020 9:03 AM

## 2020-04-25 NOTE — Discharge Summary (Signed)
[facility] Discharge Summary   Patient: John HenleDennis M Bui ZOX:[id number]  PCP: Farris HasMorrow, Aaron, MD  Date of admission: 04/22/2020   Date of discharge: 04/23/2020     Discharge Diagnoses:  Principal Problem:   Syncope and collapse Active Problems:   Arrhythmia   Hyperparathyroidism (HCC)   COVID-19 virus infection  Admitted From: home Disposition:  Home   Recommendations for Outpatient Follow-up:  1. PCP: Follow-up with PCP and cardiology as recommended 2. Follow up LABS/TEST: ZIO monitor   Follow-up Information    Lanier PrudeLambert, Cameron T, MD Follow up.   Specialties: Cardiology, Radiology Why: 05/10/2020 @ 2:45PM Contact information: [address] [place] [address] [phone]        Farris HasMorrow, Aaron, MD. Schedule an appointment as soon as possible for a visit in 1 week(s).   Specialty: Family Medicine Contact information: [address] [place] [address] [phone]              Discharge Instructions    Diet - low sodium heart healthy   Complete by: As directed    Discharge instructions   Complete by: As directed    It is important that you read the instructions as well as go over your medication list with RN to help you understand your care after this hospitalization.  Please follow-up with PCP in 1-2 weeks.  Please note that we are unable to authorize any refills for discharge medications, once you are discharged. Thus, it is imperative that you return to your primary care physician (or establish a relationship with a primary care physician if you do not have one) for your care needs. So that they can reassess your need for medications and monitor your lab values.  Please request your primary care physician to go over all Hospital Tests and Procedure/Radiological results at the follow up. Please get all Hospital records sent to your PCP by signing hospital release before you go home.   Do not drive, operating heavy  machinery, perform activities at heights, swimming or participation in water activities or provide baby sitting services because you were admitted for syncope, until you have been seen by Primary Care Physician and are cleared to do such activities.  Do not take more than prescribed Pain, Sleep and Anxiety Medications.  You were cared for by a hospitalist during your hospital stay. If you have any questions about your discharge medications or the care you received while you were in the hospital after you are discharged, you can call the hospital unit/floor you were admitted to and ask to speak with the hospitalist who took care of you. Ask for Hospitalist on call, if the hospitalist that took care of you is not available. Once you are discharged, your primary care physician will help you with any further medical issues. You Must read complete instructions/literature along with all the possible adverse reactions/side effects for all the Medicines you take and that have been prescribed to you. Take any new Medicines after you have completely understood and accept all the possible adverse reactions/side effects.   Driving Restrictions   Complete by: As directed    No driving until cleared by Cardiology.   Increase activity slowly   Complete by: As directed       Diet recommendation: Cardiac diet  Activity: The patient is advised to gradually reintroduce usual activities, as tolerated  Discharge Condition: stable  Code Status: Full code   History of present illness: As per the H  and P dictated on admission, " John Bryant is a 70 y.o. male with medical history significant of hyperparathyroidism and PVCs presents after having a syncopal episode at home. He has a history of PVCs for several years. He reports normally being able to get up, walk around, and belch which seems like it would make it resolved. However, today he felt some chest discomfort and had checked his pulse oximetry which  revealed that his heart rates were dropping from 50s to 40s before the reader cut off. Patient reported feeling as though he may pass out and had tried to get up and walk around as this normally helped in the past with the palpitation last 2-3 days patient reports that he has had a headache, sore throat, intermittent cough, and pains in between his shoulder blades. 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 He has not received any of the COVID-19 vaccines.   He was evaluated back in 2019 by Dr. Clifton James of cardiology back in 2019 and had undergone left heart cath which showed no signs of coronary artery disease LV function at that time. It appears he had been recommended to be started on metoprolol as needed, but patient never did.  In route with EMS patient was noted to have another syncopal episode with heart rates around 34 suspected to be some kind of sinus arrhythmia."  Hospital Course:  Summary of his active problems in the hospital is as following. Syncope and collapse:  Patient presents after having 2 syncopal episodes. Reported to have heart rates into the 30s during transport.  Cardiology was consulted. EP was consulted. Cardiology thinks that the patient had a clear prolonged prodrome before the syncopal episode and therefore thinks that this is a vasovagal syncope. At present did not think the patient requires any further work-up right now. Patient is recommended to have outpatient follow-up in the clinic for a 2-week ZIO monitor to exclude any other conduction disease. Currently recommended not to drive per cardiology. Echocardiogram shows normal LV function.  COVID-19 infection: Acute.  Patient reports complaints of headache, sore throat, intermittent cough, and some myalgias.  he did not receive any COVID-19 vaccinations,  come in close contact with a coworker suspected to be positive.  COVID-19 screening was positive.  Chest x-ray showed no acute abnormalities and patient is currently able to maintain O2 saturations on room air. Continue vitamins.  Hyperparathyroidism:  Calcium levels within normal limits.   Managed in the outpatient setting by Dr. Everardo All and currently takes vitamin D3 6000 units daily -Continue outpatient treatment and follow-up  Patient was ambulatory without any assistance. On the day of the discharge the patient's vitals were stable, and no other acute medical condition were reported by patient. The patient was felt safe to be discharge at Home with no therapy needed on discharge.  Consultants: none Procedures: none  DISCHARGE MEDICATION: Allergies as of 04/23/2020      Reactions   No Known Allergies       Medication List    You have not been prescribed any medications.     Discharge Exam: Filed Weights   04/22/20 0646 04/22/20 1648  Weight: 70.3 kg 68.5 kg   Vitals:   04/23/20 0449 04/23/20 1436  BP: (!) 148/101 (!) 151/96  Pulse: 82 80  Resp: 18 18  Temp: 98.4 F (36.9 C) 98 F (36.7 C)  SpO2: 96%  General: Appear in no distress, no Rash; Oral Mucosa Clear, moist. no Abnormal Neck Mass Or lumps, Conjunctiva normal  Cardiovascular: S1 and S2 Present, no Murmur Respiratory: good respiratory effort, Bilateral Air entry present and CTA, no Crackles, no wheezes Abdomen: Bowel Sound present, Soft and no tenderness Extremities: no Pedal edema Neurology: alert and oriented to time, place, and person affect appropriate. no new focal deficit  The results of significant diagnostics from this hospitalization (including imaging, microbiology, ancillary and laboratory) are listed below for reference.    Significant Diagnostic Studies: DG Chest Portable 1 View  Result Date: 04/22/2020 CLINICAL DATA:  Syncope. EXAM: PORTABLE CHEST 1 VIEW COMPARISON:  No prior. FINDINGS: Mediastinum hilar structures normal. No pulmonary infiltrates.  Tiny rounded lucency noted over the scapula/left mid lung. This could represent a tiny benign scapular cyst or tiny pneumatocele. No pleural effusion or pneumothorax. Heart size normal. Mild thoracic spine scoliosis. IMPRESSION: No acute cardiopulmonary disease. Electronically Signed   By: Maisie Fus  Register   On: 04/22/2020 07:35   ECHOCARDIOGRAM COMPLETE  Result Date: 04/22/2020    ECHOCARDIOGRAM REPORT   Patient Name:   KEATEN MASHEK [facility] Date of Exam: 04/22/2020 Medical Rec #:  [id number]         Height:       70.0 in Accession #:    [id number]        Weight:       155.0 lb Date of Birth:  09/23/1950         BSA:          1.873 m Patient Age:    69 years          BP:           132/95 mmHg Patient Gender: M                 HR:           70 bpm. Exam Location:  Inpatient Procedure: 2D Echo, 3D Echo, Strain Analysis, Cardiac Doppler and Color Doppler Indications:    Syncope 780.2 / R55  History:        Patient has no prior history of Echocardiogram examinations.                 Arrythmias:PVC. Dehydration.  Sonographer:    Leta Jungling RDCS Referring Phys: [id number] RONDELL A SMITH IMPRESSIONS  1. Left ventricular ejection fraction, by estimation, is 55 to 60%. The left ventricle has normal function. The left ventricle has no regional wall motion abnormalities. There is mild left ventricular hypertrophy. Left ventricular diastolic parameters were normal.  2. Right ventricular systolic function is normal. The right ventricular size is normal. Tricuspid regurgitation signal is inadequate for assessing PA pressure.  3. The mitral valve is normal in structure. Trivial mitral valve regurgitation.  4. The aortic valve is tricuspid. Aortic valve regurgitation is not visualized. No aortic stenosis is present.  5. The inferior vena cava is normal in size with greater than 50% respiratory variability, suggesting right atrial pressure of 3 mmHg. FINDINGS  Left Ventricle: Left ventricular ejection fraction, by estimation, is 55  to 60%. The left ventricle has normal function. The left ventricle has no regional wall motion abnormalities. The left ventricular internal cavity size was normal in size. There is  mild left ventricular hypertrophy. Left ventricular diastolic parameters were normal. Right Ventricle: The right ventricular size is normal. Right vetricular wall thickness was not assessed. Right ventricular systolic function is normal. Tricuspid regurgitation signal is inadequate  for assessing PA pressure. Left Atrium: Left atrial size was normal in size. Right Atrium: Right atrial size was normal in size. Pericardium: There is no evidence of pericardial effusion. Mitral Valve: The mitral valve is normal in structure. Trivial mitral valve regurgitation. Tricuspid Valve: The tricuspid valve is normal in structure. Tricuspid valve regurgitation is trivial. Aortic Valve: The aortic valve is tricuspid. Aortic valve regurgitation is not visualized. No aortic stenosis is present. Pulmonic Valve: The pulmonic valve was not well visualized. Pulmonic valve regurgitation is not visualized. Aorta: The aortic root and ascending aorta are structurally normal, with no evidence of dilitation. Venous: The inferior vena cava is normal in size with greater than 50% respiratory variability, suggesting right atrial pressure of 3 mmHg. IAS/Shunts: The interatrial septum was not well visualized.  LEFT VENTRICLE PLAX 2D LVIDd:         3.80 cm  Diastology LVIDs:         2.70 cm  LV e' medial:    6.96 cm/s LV PW:         0.80 cm  LV E/e' medial:  11.4 LV IVS:        1.20 cm  LV e' lateral:   8.59 cm/s LVOT diam:     1.90 cm  LV E/e' lateral: 9.2 LV SV:         51 LV SV Index:   27 LVOT Area:     2.84 cm  RIGHT VENTRICLE RV S prime:     12.60 cm/s TAPSE (M-mode): 2.2 cm LEFT ATRIUM             Index       RIGHT ATRIUM          Index LA diam:        2.90 cm 1.55 cm/m  RA Area:     9.19 cm LA Vol (A2C):   29.3 ml 15.64 ml/m RA Volume:   16.50 ml 8.81 ml/m LA  Vol (A4C):   24.0 ml 12.81 ml/m LA Biplane Vol: 25.6 ml 13.67 ml/m  AORTIC VALVE LVOT Vmax:   93.30 cm/s LVOT Vmean:  66.000 cm/s LVOT VTI:    0.179 m  AORTA Ao Root diam: 3.30 cm Ao Asc diam:  2.80 cm MITRAL VALVE MV Area (PHT): 4.06 cm    SHUNTS MV Decel Time: 187 msec    Systemic VTI:  0.18 m MV E velocity: 79.30 cm/s  Systemic Diam: 1.90 cm MV A velocity: 79.70 cm/s MV E/A ratio:  0.99 Epifanio Lesches MD Electronically signed by Epifanio Lesches MD Signature Date/Time: 04/22/2020/1:43:55 PM    Final     Microbiology: Recent Results (from the past 240 hour(s))  SARS CORONAVIRUS 2 (TAT 6-24 HRS) Nasopharyngeal Nasopharyngeal Swab     Status: Abnormal   Collection Time: 04/22/20  8:50 AM   Specimen: Nasopharyngeal Swab  Result Value Ref Range Status   SARS Coronavirus 2 POSITIVE (A) NEGATIVE Final    Comment: (NOTE) SARS-CoV-2 target nucleic acids are DETECTED.  The SARS-CoV-2 RNA is generally detectable in upper and lower respiratory specimens during the acute phase of infection. Positive results are indicative of the presence of SARS-CoV-2 RNA. Clinical correlation with patient history and other diagnostic information is  necessary to determine patient infection status. Positive results do not rule out bacterial infection or co-infection with other viruses.  The expected result is Negative.  Fact Sheet for Patients: HairSlick.no  Fact Sheet for Healthcare Providers: quierodirigir.com  This test is not yet approved  or cleared by the [place] FDA and  has been authorized for detection and/or diagnosis of SARS-CoV-2 by FDA under an Emergency Use Authorization (EUA). This EUA will remain  in effect (meaning this test can be used) for the duration of the COVID-19 declaration under Section 564(b)(1) of the Act, 21 U. S.C. section 360bbb-3(b)(1), unless the authorization is terminated or revoked sooner.   Performed  at [facility], [address] [place], [place] [address]      Labs: CBC: Recent Labs  Lab 04/22/20 0720 04/23/20 0145  WBC 6.5 3.8*  NEUTROABS 4.9  --   HGB 15.0 14.7  HCT 44.8 43.3  MCV 90.5 89.6  PLT 168 171   Basic Metabolic Panel: Recent Labs  Lab 04/22/20 0720 04/23/20 0145  NA 141 138  K 4.2 4.2  CL 107 105  CO2 26 24  GLUCOSE 106* 98  BUN 19 22  CREATININE 1.12 1.16  CALCIUM 9.8 9.8  MG 2.0 1.9   Liver Function Tests: No results for input(s): AST, ALT, ALKPHOS, BILITOT, PROT, ALBUMIN in the last 168 hours. CBG: No results for input(s): GLUCAP in the last 168 hours.  Time spent: 35 minutes  Signed:  Lynden Oxfordranav Zyeir Dymek  [facility] 04/23/2020 5:07 PM

## 2020-05-10 ENCOUNTER — Other Ambulatory Visit: Payer: Self-pay

## 2020-05-10 ENCOUNTER — Encounter: Payer: Self-pay | Admitting: Cardiology

## 2020-05-10 ENCOUNTER — Ambulatory Visit (INDEPENDENT_AMBULATORY_CARE_PROVIDER_SITE_OTHER): Payer: Medicare Other

## 2020-05-10 ENCOUNTER — Encounter: Payer: Self-pay | Admitting: *Deleted

## 2020-05-10 ENCOUNTER — Ambulatory Visit (INDEPENDENT_AMBULATORY_CARE_PROVIDER_SITE_OTHER): Payer: Medicare Other | Admitting: Cardiology

## 2020-05-10 VITALS — BP 168/110 | HR 74 | Ht 70.0 in | Wt 161.0 lb

## 2020-05-10 DIAGNOSIS — I493 Ventricular premature depolarization: Secondary | ICD-10-CM

## 2020-05-10 DIAGNOSIS — R55 Syncope and collapse: Secondary | ICD-10-CM | POA: Diagnosis not present

## 2020-05-10 NOTE — Progress Notes (Signed)
Electrophysiology Office Follow up Visit Note:    Date:  05/10/2020   ID:  John Bryant, DOB 04-15-1950, MRN [id number]  PCP:  [name], MD  [facility] Cardiologist:  [name], MD  [facility] Electrophysiologist:  None    Interval History:    John Bryant is a 70 y.o. male who presents for a follow up visit.  I last saw the patient April 23, 2020 after a syncopal episode.  Since being hospitalized, the patient reports no further episodes of syncope.  He tells me that he is extremely frustrated since leaving the hospital given his inability to drive after his syncopal episode.    Past Medical History:  Diagnosis Date  . Hypercalcemia   . Kidney stones   . Parathyroid abnormality [facility])     Past Surgical History:  Procedure Laterality Date  . LEFT HEART CATH AND CORONARY ANGIOGRAPHY N/A 05/24/2017   Procedure: LEFT HEART CATH AND CORONARY ANGIOGRAPHY;  Surgeon: [place], Peter M, MD;  Location: [facility] INVASIVE CV LAB;  Service: Cardiovascular;  Laterality: N/A;    Current Medications: No outpatient medications have been marked as taking for the 05/10/20 encounter (Office Visit) with John Prude, MD.     Allergies:   No known allergies   Social History   Socioeconomic History  . Marital status: Divorced    Spouse name: Not on file  . Number of children: Not on file  . Years of education: Not on file  . Highest education level: Not on file  Occupational History  . Not on file  Tobacco Use  . Smoking status: Never Smoker  . Smokeless tobacco: Never Used  Substance and Sexual Activity  . Alcohol use: Yes    Comment: Rare  . Drug use: No  . Sexual activity: Not on file  Other Topics Concern  . Not on file  Social History Narrative  . Not on file   Social Determinants of Health   Financial Resource Strain: Not on file  Food Insecurity: Not on file  Transportation Needs: Not on file  Physical Activity: Not on file  Stress: Not  on file  Social Connections: Not on file     Family History: The patient's family history includes Dementia in his mother; Lung cancer in his father. There is no history of Hyperparathyroidism.  ROS:   Please see the history of present illness.    All other systems reviewed and are negative.  EKGs/Labs/Other Studies Reviewed:    The following studies were reviewed today:   EKG:  The ekg ordered today demonstrates   Recent Labs: 04/22/2020: B Natriuretic Peptide 23.7; TSH 4.333 04/23/2020: BUN 22; Creatinine, Ser 1.16; Hemoglobin 14.7; Magnesium 1.9; Platelets 171; Potassium 4.2; Sodium 138  Recent Lipid Panel    Component Value Date/Time   CHOL 155 04/23/2020 0145   TRIG 200 (H) 04/23/2020 0145   HDL 34 (L) 04/23/2020 0145   CHOLHDL 4.6 04/23/2020 0145   VLDL 40 04/23/2020 0145   LDLCALC 81 04/23/2020 0145    Physical Exam:    VS:  BP (!) 168/110   Pulse 74   Ht 5\' 10"  (1.778 m)   Wt 161 lb (73 kg)   SpO2 98%   BMI 23.10 kg/m     Wt Readings from Last 3 Encounters:  05/10/20 161 lb (73 kg)  04/22/20 151 lb (68.5 kg)  12/02/19 157 lb 6.4 oz (71.4 kg)     GEN:  Well nourished, well developed in  no acute distress HEENT: Normal NECK: No JVD; No carotid bruits LYMPHATICS: No lymphadenopathy CARDIAC: RRR, no murmurs, rubs, gallops RESPIRATORY:  Clear to auscultation without rales, wheezing or rhonchi  ABDOMEN: Soft, non-tender, non-distended MUSCULOSKELETAL:  No edema; No deformity  SKIN: Warm and dry NEUROLOGIC:  Alert and oriented x 3 PSYCHIATRIC:  Normal affect   ASSESSMENT:    1. Syncope and collapse   2. PVC (premature ventricular contraction)    PLAN:    In order of problems listed above:  1. Syncope Unclear cause.  Likely vasovagal although patient did have a prolonged episode of junctional rhythm with profound bradycardia into the 30s during EMS transport.  Based on the [facility] physicians guide to driver medical education, I have  recommended he avoid driving for at least 6 months.  We will get a 2-week ZIO monitor to reassess his heart rates.  Plan to follow-up 6 months from his syncopal episode in mid July.    Medication Adjustments/Labs and Tests Ordered: Current medicines are reviewed at length with the patient today.  Concerns regarding medicines are outlined above.  Orders Placed This Encounter  Procedures  . LONG TERM MONITOR (3-14 DAYS)   No orders of the defined types were placed in this encounter.    Signed, Steffanie Dunn, MD, [facility]  05/10/2020 2:31 PM    Electrophysiology [facility]

## 2020-05-10 NOTE — Progress Notes (Signed)
Patient ID: John Bryant, male   DOB: [birth year], 70 y.o.   MRN: [id number] Patient enrolled for [employer] to ship a 14 day ZIO XT long term holter monitor to his home.

## 2020-05-10 NOTE — Patient Instructions (Signed)
Medication Instructions:  Your physician recommends that you continue on your current medications as directed. Please refer to the Current Medication list given to you today.  *If you need a refill on your cardiac medications before your next appointment, please call your pharmacy*   Lab Work: None ordered.  If you have labs (blood work) drawn today and your tests are completely normal, you will receive your results only by: Marland Kitchen MyChart Message (if you have MyChart) OR . A paper copy in the mail If you have any lab test that is abnormal or we need to change your treatment, we will call you to review the results.   Testing/Procedures: John Bryant- Long Term Monitor Instructions   Your physician has requested you wear your ZIO patch monitor___14____days.   This is a single patch monitor.  [employer] supplies one patch monitor per enrollment.  Additional stickers are not available.   Please do not apply patch if you will be having a Nuclear Stress Test, Echocardiogram, Cardiac CT, MRI, or Chest Xray during the time frame you would be wearing the monitor. The patch cannot be worn during these tests.  You cannot remove and re-apply the ZIO XT patch monitor.   Your ZIO patch monitor will be sent USPS Priority mail from [facility] directly to your home address. The monitor may also be mailed to a PO BOX if home delivery is not available.   It may take 3-5 days to receive your monitor after you have been enrolled.   Once you have received you monitor, please review enclosed instructions.  Your monitor has already been registered assigning a specific monitor serial # to you.   Applying the monitor   Shave hair from upper left chest.   Hold abrader disc by orange tab.  Rub abrader in 40 strokes over left upper chest as indicated in your monitor instructions.   Clean area with 4 enclosed alcohol pads .  Use all pads to assure are is cleaned thoroughly.  Let dry.   Apply patch as indicated in  monitor instructions.  Patch will be place under collarbone on left side of chest with arrow pointing upward.   Rub patch adhesive wings for 2 minutes.Remove white label marked "1".  Remove white label marked "2".  Rub patch adhesive wings for 2 additional minutes.   While looking in a mirror, press and release button in center of patch.  A small green light will flash 3-4 times .  This will be your only indicator the monitor has been turned on.     Do not shower for the first 24 hours.  You may shower after the first 24 hours.   Press button if you feel a symptom. You will hear a small click.  Record Date, Time and Symptom in the Patient Log Book.   When you are ready to remove patch, follow instructions on last 2 pages of Patient Log Book.  Stick patch monitor onto last page of Patient Log Book.   Place Patient Log Book in [place] box.  Use locking tab on box and tape box closed securely.  The [employer] and [employer] has [employer] on it.  Please place in mailbox as soon as possible.  Your physician should have your test results approximately 7 days after the monitor has been mailed back to [facility].   Call [facility] Customer Care at [phone] if you have questions regarding your ZIO XT patch monitor.  Call them immediately if you see  an orange light blinking on your monitor.   If your monitor falls off in less than 4 days contact our Monitor department at [phone].  If your monitor becomes loose or falls off after 4 days call [employer] at [phone] for suggestions on securing your monitor.     Follow-Up: At [facility], you and your health needs are our priority.  As part of our continuing mission to provide you with exceptional heart care, we have created designated Provider Care Teams.  These Care Teams include your primary Cardiologist (physician) and Advanced Practice Providers (APPs -  Physician Assistants and Nurse Practitioners) who all work together to provide  you with the care you need, when you need it.  We recommend signing up for the patient portal called "MyChart".  Sign up information is provided on this After Visit Summary.  MyChart is used to connect with patients for Virtual Visits (Telemedicine).  Patients are able to view lab/test results, encounter notes, upcoming appointments, etc.  Non-urgent messages can be sent to your provider as well.   To learn more about what you can do with MyChart, go to [email or web address].    Your next appointment:   6 month(s)  The format for your next appointment:   In Person  Provider:   Steffanie Dunn, MD

## 2020-05-31 NOTE — Progress Notes (Signed)
Cardiology Office Note Date:  06/01/2020  Patient ID:  John, Bryant Aug 31, 1950, MRN [id number] PCP:  John Has, MD  Cardiologist:  Dr. Eden Bryant  (2019)  > pt requested Dr. Clifton Bryant (had been planned to see back in 2019, though never did) Electrophysiologist: Dr. Lalla Bryant    Chief Complaint:    Wants f/u on driving restriction  History of Present Illness: John Bryant is a 70 y.o. male with history of hyperparathyroidism, PVCs, renal calculi,  PMD notes: mild hypercalcemia (dx'ed 2017; PTH was borderline high; he Bryant had urolithiasis since approx 2010; w/uof hypercalcemiahasotherwisebeen neg; 24-HR urine Ca++ was 324 mg;DEXA in 2019 was normal; he Bryant no h/o bony fractures; PTH scan showed activity at the right lower parathyroid area). Pt also Bryant Vit-D deficiency  He follows Q77mo with Dr. Everardo Bryant  He was admitted to [facility] 04/22/20 after a syncopal event and COVID +, in review of our consult, patient reported the events as follows: He mentions waking with an unusual and slight discomfort on his chest, had not had anything similar previously and felt a bit unwell. Other notes mentions mild COVID symptoms, w/body aches, sore throat,  He got up needing to use the bathroom, urinated and started to feel worse, grabbed his pulse ox and went to laid down, noted his O2 sats high 90s but HR was steadily dropping to the 40's and the pulse ox turned off.Historically when having PVCs he would get up and walk around usually belch and feel better, so he tried this, fairly immediately feeling weak and worse and by his friend's acciunt fainted, quickly woke though tried to get up and fainted again, and agin quickly woke. They called EMS While with them seated on the couch, started to feel weak again and asked to lay down,  EMS found him in a junctional rhythm in the 30's He laid down and immediately felt better  Outside of COVID +, labs largely unremarkable, EP was consulted given a clear  prolonged prodrome before his syncopal episode, not sounding arrhythmic, suspected was vasovagal syncope, not felt to have indication for PPM, and planned for outpt monitoring and follow up. Discharged 04/23/20  He saw Dr. Lalla Bryant  05/10/20, no recurrent syncope, reported being extremely frustrated with driving restriction.  Dr John Bryant again discussed suspect hat the episode was vagal, though given prolonged period of junctional rhythm associated with this discussed with the patient [place] driving restriction of 12mo and planned for 2 week Zio monitor to reassess his HR.  TODAY He feels very well. Is walking to work about 2 miles each way and Bryant very good exertional capacity. Now even finds himself looking forward to it, but would really like to re-visit his driving restriction.  Understands the rational for the law, Bryant done some personal investigation and reports that an MD can override the 38mo restriction if deemed appropriate.  He does not want to follow with Dr. Lalla Bryant, felt his black and white decision on no driving was dismissive and opted not to wear the monitor if it was not going to make a difference in his decision about driving.  He Bryant minimal burden of his PVCs, if anything the burden of palpitations have lessened over the years. He Bryant never fainted before the above event or since. No CP, no SOB or DOE. No dizzy spells, near syncope He feels well.   Past Medical History:  Diagnosis Date  . Hypercalcemia   . Kidney stones   . Parathyroid abnormality (HCC)  Past Surgical History:  Procedure Laterality Date  . LEFT HEART CATH AND CORONARY ANGIOGRAPHY N/A 05/24/2017   Procedure: LEFT HEART CATH AND CORONARY ANGIOGRAPHY;  Surgeon: Bryant, John M, MD;  Location: [facility] INVASIVE CV LAB;  Service: Cardiovascular;  Laterality: N/A;    No current outpatient medications on file.   No current facility-administered medications for this visit.    Allergies:   No known allergies    Social History:  The patient  reports that he Bryant never smoked. He Bryant never used smokeless tobacco. He reports current alcohol use. He reports that he does not use drugs.   Family History:  The patient's family history includes Dementia in his mother; Lung cancer in his father.  ROS:  Please see the history of present illness.    Bryant other systems are reviewed and otherwise negative.   PHYSICAL EXAM:  VS:  BP (!) 156/82   Pulse 83   Ht 5\' 10"  (1.778 m)   Wt 161 lb 6.4 oz (73.2 kg)   SpO2 98%   BMI 23.16 kg/m  BMI: Body mass index is 23.16 kg/m. Well nourished, well developed, in no acute distress HEENT: normocephalic, atraumatic Neck: no JVD, carotid bruits or masses Cardiac:  RRR; no significant murmurs, no rubs, or gallops Lungs:  CTA b/l, no wheezing, rhonchi or rales Abd: soft, nontender MS: no deformity or atrophy Ext: no edema Skin: warm and dry, no rash Neuro:  No gross deficits appreciated Psych: euthymic mood, full affect   EKG:  Not done today   04/22/2020: TTE IMPRESSIONS  1. Left ventricular ejection fraction, by estimation, is 55 to 60%. The  left ventricle Bryant normal function. The left ventricle Bryant no regional  wall motion abnormalities. There is mild left ventricular hypertrophy.  Left ventricular diastolic parameters  were normal.  2. Right ventricular systolic function is normal. The right ventricular  size is normal. Tricuspid regurgitation signal is inadequate for assessing  PA pressure.  3. The mitral valve is normal in structure. Trivial mitral valve  regurgitation.  4. The aortic valve is tricuspid. Aortic valve regurgitation is not  visualized. No aortic stenosis is present.  5. The inferior vena cava is normal in size with greater than 50%  respiratory variability, suggesting right atrial pressure of 3 mmHg.    May 2019 48hr monitor Sinus rhythm  Frequent Premature ventricular contractions (5455 during monitoring period) with  bigeminy and couplets. He Bryant several fusion beats but no ventricular tachycardia.  Rare premature atrial contractions. (21 during monitoring period).     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  Ost LM lesion is 25% stenosed.  The left ventricular systolic function is normal.  LV end diastolic pressure is normal.  The left ventricular ejection fraction is 55-65% by visual estimate.  1. No significant CAD 2. Normal LV function 3. Normal LVEDP  Plan: It appears his ETT is falsely abnormal. Recommend risk factor modification.  Recent Labs: 04/22/2020: B Natriuretic Peptide 23.7; TSH 4.333 04/23/2020: BUN 22; Creatinine, Ser 1.16; Hemoglobin 14.7; Magnesium 1.9; Platelets 171; Potassium 4.2; Sodium 138  04/23/2020: Cholesterol 155; HDL 34; LDL Cholesterol 81; Total CHOL/HDL Ratio 4.6; Triglycerides 200; VLDL 40   CrCl cannot be calculated (Patient's most recent lab result is older than the maximum 21 days allowed.).   Wt Readings from Last 3 Encounters:  06/01/20 161 lb 6.4 oz (73.2 kg)  05/10/20 161 lb (73 kg)  04/22/20 151 lb (68.5 kg)  Other studies reviewed: Additional studies/records reviewed today include: summarized above  ASSESSMENT AND PLAN:  1. PVCs     Minimal burden by symptoms  2. Syncope     Suspect vagally mediate, associated with viral illness and junctional rhythm      No hx of syncope otherwise. We discussed rational for no driving, he understands though given circumstances would like to re-discuss with another MD He is agreeable to wear the monitor, to evaluate if he Bryant any rate/rhythm abnormalities noted during a well/baseline state of health.  He was slated to transition to Dr. Clifton Bryant back in 2019, I don't think he needs EP necessarily and will plan to have him establish with Dr. Clifton Bryant if follow up after his monitor is completed, he is agreeable to that. I will send my note to Dr. Clifton Bryant and his RN   Disposition: F/u as  above  Current medicines are reviewed at length with the patient today.  The patient did not have any concerns regarding medicines.  Norma Fredrickson, PA-C 06/01/2020 9:43 AM     [facility] [address] [place] [address] [phone] (office)  [phone] (fax)

## 2020-06-01 ENCOUNTER — Other Ambulatory Visit: Payer: Self-pay

## 2020-06-01 ENCOUNTER — Ambulatory Visit (INDEPENDENT_AMBULATORY_CARE_PROVIDER_SITE_OTHER): Payer: Medicare Other | Admitting: Physician Assistant

## 2020-06-01 ENCOUNTER — Encounter: Payer: Self-pay | Admitting: Physician Assistant

## 2020-06-01 VITALS — BP 156/82 | HR 83 | Ht 70.0 in | Wt 161.4 lb

## 2020-06-01 DIAGNOSIS — R55 Syncope and collapse: Secondary | ICD-10-CM

## 2020-06-01 DIAGNOSIS — I493 Ventricular premature depolarization: Secondary | ICD-10-CM

## 2020-06-01 NOTE — Patient Instructions (Signed)
Medication Instructions:    TO START AN  DAILY OVER THE COUNTER VITAMIN  D SUPPLEMENT  PER YOUR CHOICE   *If you need a refill on your cardiac medications before your next appointment, please call your pharmacy*   Lab Work:NONE ORDERED  TODAY   If you have labs (blood work) drawn today and your tests are completely normal, you will receive your results only by: Marland Kitchen MyChart Message (if you have MyChart) OR . A paper copy in the mail If you have any lab test that is abnormal or we need to change your treatment, we will call you to review the results.   Testing/Procedures:  PLEASE WEAR 14 DAY  MONITOR  THAT WAS  ALREADY ORDERED PER LAMBERT    Follow-Up: At [facility], you and your health needs are our priority.  As part of our continuing mission to provide you with exceptional heart care, we have created designated Provider Care Teams.  These Care Teams include your primary Cardiologist (physician) and Advanced Practice Providers (APPs -  Physician Assistants and Nurse Practitioners) who all work together to provide you with the care you need, when you need it.  We recommend signing up for the patient portal called "MyChart".  Sign up information is provided on this After Visit Summary.  MyChart is used to connect with patients for Virtual Visits (Telemedicine).  Patients are able to view lab/test results, encounter notes, upcoming appointments, etc.  Non-urgent messages can be sent to your provider as well.   To learn more about what you can do with MyChart, go to [email or web address].    Your next appointment:   3-4  week(s)  The format for your next appointment:   In Person  Provider:   [name], MD ONLY    Other Instructions

## 2020-06-07 ENCOUNTER — Other Ambulatory Visit: Payer: Self-pay

## 2020-06-09 ENCOUNTER — Other Ambulatory Visit: Payer: Self-pay

## 2020-06-09 ENCOUNTER — Ambulatory Visit (INDEPENDENT_AMBULATORY_CARE_PROVIDER_SITE_OTHER): Payer: Medicare Other | Admitting: Endocrinology

## 2020-06-09 NOTE — Patient Instructions (Addendum)
Your blood pressure is high today.  Please see your primary care provider soon, to have it rechecked.   Blood tests are requested for you today.  We'll let you know about the results.   Please come back for a follow-up appointment in 1 year.   

## 2020-06-09 NOTE — Progress Notes (Signed)
   Subjective:    Patient ID: John Bryant, male    DOB: [birth year], 70 y.o.   MRN: [id number]  HPI Pt returns for f/u of borderline hypercalcemia (dx'ed 2017; PTH was borderline high; he has had urolithiasis (never had stone analysis) since approx 2010; w/u of hypercalcemia has otherwise been neg; 24-HR urine Ca++ was 324 mg; DEXA in 2019 was normal; he has no h/o bony fractures; PTH scan showed activity at the right lower parathyroid area).    Pt also has Vit-D deficiency (dx'ed 2017; he takes 6000 units/day).  No recent episodes of urolithiasis.   Past Medical History:  Diagnosis Date  . Hypercalcemia   . Kidney stones   . Parathyroid abnormality [facility])     Past Surgical History:  Procedure Laterality Date  . LEFT HEART CATH AND CORONARY ANGIOGRAPHY N/A 05/24/2017   Procedure: LEFT HEART CATH AND CORONARY ANGIOGRAPHY;  Surgeon: Swaziland, Peter M, MD;  Location: [facility] INVASIVE CV LAB;  Service: Cardiovascular;  Laterality: N/A;    Social History   Socioeconomic History  . Marital status: Divorced    Spouse name: Not on file  . Number of children: Not on file  . Years of education: Not on file  . Highest education level: Not on file  Occupational History  . Not on file  Tobacco Use  . Smoking status: Never Smoker  . Smokeless tobacco: Never Used  Vaping Use  . Vaping Use: Never used  Substance and Sexual Activity  . Alcohol use: Yes    Comment: Rare  . Drug use: No  . Sexual activity: Not on file  Other Topics Concern  . Not on file  Social History Narrative  . Not on file   Social Determinants of Health   Financial Resource Strain: Not on file  Food Insecurity: Not on file  Transportation Needs: Not on file  Physical Activity: Not on file  Stress: Not on file  Social Connections: Not on file  Intimate Partner Violence: Not on file    No current outpatient medications on file prior to visit.   No current facility-administered medications on file prior to visit.     Allergies  Allergen Reactions  . No Known Allergies     Family History  Problem Relation Age of Onset  . Dementia Mother   . Lung cancer Father   . Hyperparathyroidism Neg Hx     BP (!) 156/92 (BP Location: Right Arm, Patient Position: Sitting, Cuff Size: Normal)   Pulse 67   Ht 5\' 10"  (1.778 m)   Wt 160 lb 12.8 oz (72.9 kg)   SpO2 98%   BMI 23.07 kg/m    Review of Systems     Objective:   Physical Exam VITAL SIGNS:  See vs page GENERAL: no distress EXT: no leg edema   25-OH vit-D=64    Assessment & Plan:  HTN: is noted today Vit-D def: well-replaced.  Reduce to 5000 units/d Hypercalcemia: recheck today  Patient Instructions  Your blood pressure is high today.  Please see your primary care provider soon, to have it rechecked Blood tests are requested for you today.  We'll let you know about the results.  Please come back for a follow-up appointment in 1 year.

## 2020-06-10 LAB — PTH, INTACT AND CALCIUM
Calcium: 10.2 mg/dL (ref 8.6–10.3)
PTH: 93 pg/mL — ABNORMAL HIGH (ref 14–64)

## 2020-06-10 LAB — VITAMIN D 25 HYDROXY (VIT D DEFICIENCY, FRACTURES): VITD: 63.37 ng/mL (ref 30.00–100.00)

## 2020-07-07 NOTE — Progress Notes (Signed)
Chief Complaint  Patient presents with  . Follow-up    Syncope   History of Present Illness: 70 yo male with history of mild CAD, PVCs, hyperparathyroidism, kidney stones and syncopal event felt to be vasovagal who is here today for cardiac follow up. I am meeting him for the first time today. He has been followed by Dr. Lalla Brothers in our EP clinic. He was admitted to [facility] January 2022 after a syncopal event. He was Covid positive. He felt weak and dizzy and his heart rate was in the 40s. He passed out twice. EMS called and found him in a junctional rhythm. He was seen in the hospital by our EP team and in follow up by Dr. Lalla Brothers. His event was felt to be vasovagal. Echo 04/22/20 with LVEF=55-60%. No significant valve disease. 30 day cardiac monitor February 2022 with Sinus, rare PACs and rare PVCs. No plans for further EP workup. Cardiac cath in February 2019 with mild ostial left main and mild mid LAD stenosis.   He is here today for follow up. The patient denies any chest pain, dyspnea, palpitations, lower extremity edema, orthopnea, PND, dizziness, near syncope or syncope.   Primary Care Physician: [name], MD   Past Medical History:  Diagnosis Date  . Hypercalcemia   . Kidney stones   . Parathyroid abnormality (HCC)   . Vasovagal syncope     Past Surgical History:  Procedure Laterality Date  . LEFT HEART CATH AND CORONARY ANGIOGRAPHY N/A 05/24/2017   Procedure: LEFT HEART CATH AND CORONARY ANGIOGRAPHY;  Surgeon: Swaziland, Peter M, MD;  Location: [facility] INVASIVE CV LAB;  Service: Cardiovascular;  Laterality: N/A;    No current outpatient medications on file.   No current facility-administered medications for this visit.    Allergies  Allergen Reactions  . No Known Allergies     Social History   Socioeconomic History  . Marital status: Divorced    Spouse name: Not on file  . Number of children: Not on file  . Years of education: Not on file  . Highest education level:  Not on file  Occupational History  . Not on file  Tobacco Use  . Smoking status: Never Smoker  . Smokeless tobacco: Never Used  Vaping Use  . Vaping Use: Never used  Substance and Sexual Activity  . Alcohol use: Yes    Comment: Rare  . Drug use: No  . Sexual activity: Not on file  Other Topics Concern  . Not on file  Social History Narrative  . Not on file   Social Determinants of Health   Financial Resource Strain: Not on file  Food Insecurity: Not on file  Transportation Needs: Not on file  Physical Activity: Not on file  Stress: Not on file  Social Connections: Not on file  Intimate Partner Violence: Not on file    Family History  Problem Relation Age of Onset  . Dementia Mother   . Lung cancer Father   . Hyperparathyroidism Neg Hx     Review of Systems:  As stated in the HPI and otherwise negative.   BP (!) 144/78   Pulse 70   Ht 5\' 10"  (1.778 m)   Wt 163 lb 6.4 oz (74.1 kg)   SpO2 98%   BMI 23.45 kg/m   Physical Examination: General: Well developed, well nourished, NAD  HEENT: OP clear, mucus membranes moist  SKIN: warm, dry. No rashes. Neuro: No focal deficits  Musculoskeletal: Muscle strength 5/5 all ext  Psychiatric: Mood and affect normal  Neck: No JVD, no carotid bruits, no thyromegaly, no lymphadenopathy.  Lungs:Clear bilaterally, no wheezes, rhonci, crackles Cardiovascular: Regular rate and rhythm. No murmurs, gallops or rubs. Abdomen:Soft. Bowel sounds present. Non-tender.  Extremities: No lower extremity edema. Pulses are 2 + in the bilateral DP/PT.  EKG:  EKG is not ordered today. The ekg ordered today demonstrates   Echo 04/22/20: 1. Left ventricular ejection fraction, by estimation, is 55 to 60%. The  left ventricle has normal function. The left ventricle has no regional  wall motion abnormalities. There is mild left ventricular hypertrophy.  Left ventricular diastolic parameters  were normal.  2. Right ventricular systolic  function is normal. The right ventricular  size is normal. Tricuspid regurgitation signal is inadequate for assessing  PA pressure.  3. The mitral valve is normal in structure. Trivial mitral valve  regurgitation.  4. The aortic valve is tricuspid. Aortic valve regurgitation is not  visualized. No aortic stenosis is present.  5. The inferior vena cava is normal in size with greater than 50%  respiratory variability, suggesting right atrial pressure of 3 mmHg.   Recent Labs: 04/22/2020: B Natriuretic Peptide 23.7; TSH 4.333 04/23/2020: BUN 22; Creatinine, Ser 1.16; Hemoglobin 14.7; Magnesium 1.9; Platelets 171; Potassium 4.2; Sodium 138   Lipid Panel    Component Value Date/Time   CHOL 155 04/23/2020 0145   TRIG 200 (H) 04/23/2020 0145   HDL 34 (L) 04/23/2020 0145   CHOLHDL 4.6 04/23/2020 0145   VLDL 40 04/23/2020 0145   LDLCALC 81 04/23/2020 0145     Wt Readings from Last 3 Encounters:  07/08/20 163 lb 6.4 oz (74.1 kg)  06/09/20 160 lb 12.8 oz (72.9 kg)  06/01/20 161 lb 6.4 oz (73.2 kg)      Assessment and Plan:   1. Syncope: His event is felt to be vasovagal. No indication of any other cause. I think he can return to driving. He will call with any dizziness or near syncope.   2. CAD without angina: Mild CAD by cath in 2019. Will discuss adding ASA and low dose statin.   Current medicines are reviewed at length with the patient today.  The patient does not have concerns regarding medicines.  The following changes have been made:  no change  Labs/ tests ordered today include:  No orders of the defined types were placed in this encounter.    Disposition:   FU with me in 12 months.    Signed, [name], MD 07/08/2020 9:52 AM    [facility] [address] [place], [place], [place]  [address] Phone: [phone]; Fax: [phone]

## 2020-07-08 ENCOUNTER — Encounter: Payer: Self-pay | Admitting: Cardiovascular Disease

## 2020-07-08 ENCOUNTER — Other Ambulatory Visit: Payer: Self-pay

## 2020-07-08 ENCOUNTER — Ambulatory Visit: Payer: Medicare Other | Admitting: Cardiovascular Disease

## 2020-07-08 VITALS — BP 144/78 | HR 70 | Ht 70.0 in | Wt 163.4 lb

## 2020-07-08 DIAGNOSIS — I2510 Atherosclerotic heart disease of native coronary artery without angina pectoris: Secondary | ICD-10-CM | POA: Diagnosis not present

## 2020-07-08 DIAGNOSIS — R55 Syncope and collapse: Secondary | ICD-10-CM

## 2020-07-08 NOTE — Patient Instructions (Signed)

## 2021-04-13 DIAGNOSIS — H2513 Age-related nuclear cataract, bilateral: Secondary | ICD-10-CM | POA: Diagnosis not present

## 2021-06-14 ENCOUNTER — Ambulatory Visit: Payer: Medicare Other | Admitting: Endocrinology

## 2021-06-14 ENCOUNTER — Other Ambulatory Visit: Payer: Self-pay

## 2021-06-14 VITALS — BP 150/90 | HR 63 | Ht 70.0 in | Wt 160.8 lb

## 2021-06-14 DIAGNOSIS — E213 Hyperparathyroidism, unspecified: Secondary | ICD-10-CM

## 2021-06-14 LAB — VITAMIN D 25 HYDROXY (VIT D DEFICIENCY, FRACTURES): VITD: 50.89 ng/mL (ref 30.00–100.00)

## 2021-06-14 NOTE — Patient Instructions (Addendum)
Your blood pressure is high today.  Please see your primary care provider soon, to have it rechecked.   Blood tests are requested for you today.  We'll let you know about the results.   Please come back for a follow-up appointment in 1 year.   

## 2021-06-14 NOTE — Progress Notes (Signed)
? ?  Subjective:  ? ? Patient ID: John Bryant, male    DOB: 04-07-1951, 71 y.o.   MRN: [id number] ? ?HPI ?Pt returns for f/u of mild primary hyperparathyroidism (dx'ed 2017; PTH was borderline high; he has had urolithiasis (never had stone analysis) since approx 2010; other w/u of hypercalcemia was neg; 24-HR urine Ca++ was 324 mg; DEXA in 2019 was normal; he has no h/o bony fractures; PTH scan showed activity at the right lower parathyroid area).   ?Pt also has Vit-D deficiency (he takes 5000 units/day).  No recent episodes of urolithiasis.   ?Past Medical History:  ?Diagnosis Date  ? Hypercalcemia   ? Kidney stones   ? Parathyroid abnormality (HCC)   ? Vasovagal syncope   ? ? ?Past Surgical History:  ?Procedure Laterality Date  ? LEFT HEART CATH AND CORONARY ANGIOGRAPHY N/A 05/24/2017  ? Procedure: LEFT HEART CATH AND CORONARY ANGIOGRAPHY;  Surgeon: Swaziland, Peter M, MD;  Location: [facility] INVASIVE CV LAB;  Service: Cardiovascular;  Laterality: N/A;  ? ? ?Social History  ? ?Socioeconomic History  ? Marital status: Divorced  ?  Spouse name: Not on file  ? Number of children: Not on file  ? Years of education: Not on file  ? Highest education level: Not on file  ?Occupational History  ? Not on file  ?Tobacco Use  ? Smoking status: Never  ? Smokeless tobacco: Never  ?Vaping Use  ? Vaping Use: Never used  ?Substance and Sexual Activity  ? Alcohol use: Yes  ?  Comment: Rare  ? Drug use: No  ? Sexual activity: Not on file  ?Other Topics Concern  ? Not on file  ?Social History Narrative  ? Not on file  ? ?Social Determinants of Health  ? ?Financial Resource Strain: Not on file  ?Food Insecurity: Not on file  ?Transportation Needs: Not on file  ?Physical Activity: Not on file  ?Stress: Not on file  ?Social Connections: Not on file  ?Intimate Partner Violence: Not on file  ? ? ?No current outpatient medications on file prior to visit.  ? ?No current facility-administered medications on file prior to visit.  ? ? ?Allergies   ?Allergen Reactions  ? No Known Allergies   ? ? ?Family History  ?Problem Relation Age of Onset  ? Dementia Mother   ? Lung cancer Father   ? Hyperparathyroidism Neg Hx   ? ? ?BP (!) 150/90   Pulse 63   Ht 5\' 10"  (1.778 m)   Wt 160 lb 12.8 oz (72.9 kg)   SpO2 97%   BMI 23.07 kg/m?  ? ? ? ?Review of Systems ?Denies muscle weakness. ?   ?Objective:  ? Physical Exam ?VITAL SIGNS:  See vs page ?GENERAL: no distress ?GAIT: normal and steady.   ? ?25-OH Vit-D=51 ?   ?Assessment & Plan:  ?Vit-D def: well-controlled.  Please continue the same vit-D supplement ?Primary hyperparathyroidism: recheck today ? ?

## 2021-06-15 LAB — PTH, INTACT AND CALCIUM
Calcium: 10.7 mg/dL — ABNORMAL HIGH (ref 8.6–10.3)
PTH: 82 pg/mL — ABNORMAL HIGH (ref 16–77)

## 2021-07-07 NOTE — Progress Notes (Signed)
? ?Chief Complaint  ?Patient presents with  ? Follow-up  ?  CAD  ? ?History of Present Illness: 71 yo male with history of mild CAD, PVCs, hyperparathyroidism, kidney stones and syncopal event felt to be vasovagal who is here today for cardiac follow up. I met him in 2022. He has been followed by Dr. Quentin Ore in our EP clinic. He was admitted to [facility] January 2022 after a syncopal event. He was Covid positive. He felt weak and dizzy and his heart rate was in the 40s. He passed out twice. EMS called and found him in a junctional rhythm. He was seen in the hospital by our EP team and in follow up by Dr. Quentin Ore. His event was felt to be vasovagal. Echo 04/22/20 with LVEF=55-60%. No significant valve disease. 30 day cardiac monitor February 2022 with Sinus, rare PACs and rare PVCs. No plans for further EP workup. Cardiac cath in February 2019 with mild ostial left main and mild mid LAD stenosis. He has not wished to take a statin or ASA.  ? ?He is here today for follow up. The patient denies any chest pain, dyspnea, palpitations, lower extremity edema, orthopnea, PND, dizziness, near syncope or syncope.  ? ?Primary Care Physician: London Pepper, MD ? ?Past Medical History:  ?Diagnosis Date  ? Hypercalcemia   ? Kidney stones   ? Parathyroid abnormality ([place])   ? Vasovagal syncope   ? ? ?Past Surgical History:  ?Procedure Laterality Date  ? LEFT HEART CATH AND CORONARY ANGIOGRAPHY N/A 05/24/2017  ? Procedure: LEFT HEART CATH AND CORONARY ANGIOGRAPHY;  Surgeon: Martinique, Peter M, MD;  Location: [facility];  Service: Cardiovascular;  Laterality: N/A;  ? ? ?Current Outpatient Medications  ?Medication Sig Dispense Refill  ? aspirin EC 81 MG tablet Take 1 tablet (81 mg total) by mouth daily. Swallow whole. 90 tablet 3  ? VITAMIN D PO Take 5,000 Units by mouth daily.    ? ?No current facility-administered medications for this visit.  ? ? ?Allergies  ?Allergen Reactions  ? No Known Allergies   ? ? ?Social History   ? ?Socioeconomic History  ? Marital status: Divorced  ?  Spouse name: Not on file  ? Number of children: Not on file  ? Years of education: Not on file  ? Highest education level: Not on file  ?Occupational History  ? Not on file  ?Tobacco Use  ? Smoking status: Never  ? Smokeless tobacco: Never  ?Vaping Use  ? Vaping Use: Never used  ?Substance and Sexual Activity  ? Alcohol use: Yes  ?  Comment: Rare  ? Drug use: No  ? Sexual activity: Not on file  ?Other Topics Concern  ? Not on file  ?Social History Narrative  ? Not on file  ? ?Social Determinants of Health  ? ?Financial Resource Strain: Not on file  ?Food Insecurity: Not on file  ?Transportation Needs: Not on file  ?Physical Activity: Not on file  ?Stress: Not on file  ?Social Connections: Not on file  ?Intimate Partner Violence: Not on file  ? ? ?Family History  ?Problem Relation Age of Onset  ? Dementia Mother   ? Lung cancer Father   ? Hyperparathyroidism Neg Hx   ? ? ?Review of Systems:  As stated in the HPI and otherwise negative.  ? ?BP 138/80   Pulse 69   Ht $R'5\' 10"'aP$  (1.778 m)   Wt 158 lb (71.7 kg)   SpO2 98%   BMI  22.67 kg/m?  ? ?Physical Examination: ?General: Well developed, well nourished, NAD  ?HEENT: OP clear, mucus membranes moist  ?SKIN: warm, dry. No rashes. ?Neuro: No focal deficits  ?Musculoskeletal: Muscle strength 5/5 all ext  ?Psychiatric: Mood and affect normal  ?Neck: No JVD, no carotid bruits, no thyromegaly, no lymphadenopathy.  ?Lungs:Clear bilaterally, no wheezes, rhonci, crackles ?Cardiovascular: Regular rate and rhythm. No murmurs, gallops or rubs. ?Abdomen:Soft. Bowel sounds present. Non-tender.  ?Extremities: No lower extremity edema. Pulses are 2 + in the bilateral DP/PT. ? ?EKG:  EKG is ordered today. ?The ekg ordered today demonstrates sinus ? ?Echo 04/22/20: ? 1. Left ventricular ejection fraction, by estimation, is 55 to 60%. The  ?left ventricle has normal function. The left ventricle has no regional  ?wall motion  abnormalities. There is mild left ventricular hypertrophy.  ?Left ventricular diastolic parameters  ?were normal.  ? 2. Right ventricular systolic function is normal. The right ventricular  ?size is normal. Tricuspid regurgitation signal is inadequate for assessing  ?PA pressure.  ? 3. The mitral valve is normal in structure. Trivial mitral valve  ?regurgitation.  ? 4. The aortic valve is tricuspid. Aortic valve regurgitation is not  ?visualized. No aortic stenosis is present.  ? 5. The inferior vena cava is normal in size with greater than 50%  ?respiratory variability, suggesting right atrial pressure of 3 mmHg.  ? ?Recent Labs: ?No results found for requested labs within last 8760 hours.  ? ?Lipid Panel ?   ?Component Value Date/Time  ? CHOL 155 04/23/2020 0145  ? TRIG 200 (H) 04/23/2020 0145  ? HDL 34 (L) 04/23/2020 0145  ? CHOLHDL 4.6 04/23/2020 0145  ? VLDL 40 04/23/2020 0145  ? [place] 81 04/23/2020 0145  ? ?  ?Wt Readings from Last 3 Encounters:  ?07/08/21 158 lb (71.7 kg)  ?06/14/21 160 lb 12.8 oz (72.9 kg)  ?07/08/20 163 lb 6.4 oz (74.1 kg)  ?  ? ? ?Assessment and Plan:  ? ?1. CAD without angina: Mild CAD by cath in 2019. He has no exertional chest pain. He will start ASA 81 mg daily.  ? ?Current medicines are reviewed at length with the patient today.  The patient does not have concerns regarding medicines. ? ?The following changes have been made:  no change ? ?Labs/ tests ordered today include:  ? ?Orders Placed This Encounter  ?Procedures  ? EKG 12-Lead  ? ? ? ?Disposition:   F/U with me in 12 months.  ? ? ?Signed, ?Lauree Chandler, MD ?07/08/2021 9:03 AM    ?[facility] ?[place], [place], [place]  [address] ?Phone: [phone]; Fax: [phone]  ? ? ?

## 2021-07-08 ENCOUNTER — Ambulatory Visit: Payer: Medicare Other | Admitting: Cardiovascular Disease

## 2021-07-08 ENCOUNTER — Encounter: Payer: Self-pay | Admitting: Cardiovascular Disease

## 2021-07-08 VITALS — BP 138/80 | HR 69 | Ht 70.0 in | Wt 158.0 lb

## 2021-07-08 DIAGNOSIS — I2510 Atherosclerotic heart disease of native coronary artery without angina pectoris: Secondary | ICD-10-CM | POA: Diagnosis not present

## 2021-07-08 MED ORDER — ASPIRIN EC 81 MG PO TBEC: 81.0000 mg | DELAYED_RELEASE_TABLET | Freq: Every day | ORAL | 3 refills | Status: AC

## 2021-07-08 NOTE — Patient Instructions (Signed)
Medication Instructions:  ?Your physician has recommended you make the following change in your medication:  ?1.) start ASPIRIN 81 mg - take one tablet by mouth daily ? ?*If you need a refill on your cardiac medications before your next appointment, please call your pharmacy* ? ? ?Lab Work: ?none ?If you have labs (blood work) drawn today and your tests are completely normal, you will receive your results only by: ?MyChart Message (if you have MyChart) OR ?A paper copy in the mail ?If you have any lab test that is abnormal or we need to change your treatment, we will call you to review the results. ? ? ?Testing/Procedures: ?none ? ? ?Follow-Up: ?At [facility], you and your health needs are our priority.  As part of our continuing mission to provide you with exceptional heart care, we have created designated Provider Care Teams.  These Care Teams include your primary Cardiologist (physician) and Advanced Practice Providers (APPs -  Physician Assistants and Nurse Practitioners) who all work together to provide you with the care you need, when you need it. ? ?We recommend signing up for the patient portal called "MyChart".  Sign up information is provided on this After Visit Summary.  MyChart is used to connect with patients for Virtual Visits (Telemedicine).  Patients are able to view lab/test results, encounter notes, upcoming appointments, etc.  Non-urgent messages can be sent to your provider as well.   ?To learn more about what you can do with MyChart, go to [email or web address].   ? ?Your next appointment:   ?12 month(s) ? ?The format for your next appointment:   ?In Person ? ?Provider:   ?[name], MD   ? ? ?Other Instructions ?  ?

## 2022-01-04 ENCOUNTER — Telehealth: Payer: Self-pay

## 2022-01-04 NOTE — Patient Outreach (Signed)
  Care Coordination   Initial Visit Note   01/04/2022 Name: John Bryant MRN: [id number] DOB: [birth year]  John Bryant is a 71 y.o. year old male who sees London Pepper, MD for primary care. I spoke with  Damian Leavell by phone today.  What matters to the patients health and wellness today? No Concerns Addressed/Patient declined answering questions over the phone.   Goals Addressed   None     SDOH assessments and interventions completed:  No     Care Coordination Interventions Activated:  No  Care Coordination Interventions:  No, not indicated   Follow up plan:  Patient declined answering questions.    Encounter Outcome:  Pt. [place] [facility] [phone]

## 2022-04-17 DIAGNOSIS — H2513 Age-related nuclear cataract, bilateral: Secondary | ICD-10-CM | POA: Diagnosis not present

## 2022-08-29 DIAGNOSIS — E785 Hyperlipidemia, unspecified: Secondary | ICD-10-CM | POA: Diagnosis not present

## 2022-08-29 DIAGNOSIS — Z0000 Encounter for general adult medical examination without abnormal findings: Secondary | ICD-10-CM | POA: Diagnosis not present

## 2022-09-06 ENCOUNTER — Encounter: Payer: Self-pay | Admitting: Cardiovascular Disease

## 2022-09-06 ENCOUNTER — Ambulatory Visit: Payer: Medicare Other | Attending: Cardiovascular Disease | Admitting: Cardiovascular Disease

## 2022-09-06 VITALS — BP 142/98 | HR 68 | Ht 70.0 in | Wt 158.0 lb

## 2022-09-06 DIAGNOSIS — I2510 Atherosclerotic heart disease of native coronary artery without angina pectoris: Secondary | ICD-10-CM

## 2022-09-06 NOTE — Patient Instructions (Signed)
Medication Instructions:  Your physician recommends that you continue on your current medications as directed. Please refer to the Current Medication list given to you today.  *If you need a refill on your cardiac medications before your next appointment, please call your pharmacy*   Lab Work: NONE  If you have labs (blood work) drawn today and your tests are completely normal, you will receive your results only by: MyChart Message (if you have MyChart) OR A paper copy in the mail If you have any lab test that is abnormal or we need to change your treatment, we will call you to review the results.   Testing/Procedures: NONE   Follow-Up: At [facility], you and your health needs are our priority.  As part of our continuing mission to provide you with exceptional heart care, we have created designated Provider Care Teams.  These Care Teams include your primary Cardiologist (physician) and Advanced Practice Providers (APPs -  Physician Assistants and Nurse Practitioners) who all work together to provide you with the care you need, when you need it.   Your next appointment:   1 year(s)  Provider:   Christopher McAlhany, MD      

## 2022-09-06 NOTE — Progress Notes (Signed)
Chief Complaint  Patient presents with   Follow-up    CAD    History of Present Illness: 72 yo male with history of mild CAD, PVCs, hyperparathyroidism, kidney stones and syncopal event felt to be vasovagal who is here today for cardiac follow up. I met him in 2022. He has been followed by Dr. Lalla Brothers in our EP clinic. He was admitted to [facility] January 2022 after a syncopal event. He was Covid positive. He felt weak and dizzy and his heart rate was in the 40s. He passed out twice. EMS called and found him in a junctional rhythm. He was seen in the hospital by our EP team and in follow up by Dr. Lalla Brothers. His event was felt to be vasovagal. Echo 04/22/20 with LVEF=55-60%. No significant valve disease. 30 day cardiac monitor February 2022 with sinus, rare PACs and rare PVCs. No plans for further EP workup. Cardiac cath in February 2019 with mild ostial left main and mild mid LAD stenosis. He has not wished to take a statin.    He is here today for follow up. The patient denies any chest pain, dyspnea, palpitations, lower extremity edema, orthopnea, PND, dizziness, near syncope or syncope.   Primary Care Physician: [name], MD  Past Medical History:  Diagnosis Date   CAD (coronary artery disease)    Hypercalcemia    Kidney stones    Parathyroid abnormality [facility])    Vasovagal syncope     Past Surgical History:  Procedure Laterality Date   LEFT HEART CATH AND CORONARY ANGIOGRAPHY N/A 05/24/2017   Procedure: LEFT HEART CATH AND CORONARY ANGIOGRAPHY;  Surgeon: [place]az[place], Peter M, MD;  Location: [facility] INVASIVE CV LAB;  Service: Cardiovascular;  Laterality: N/A;    Current Outpatient Medications  Medication Sig Dispense Refill   aspirin EC 81 MG tablet Take 1 tablet (81 mg total) by mouth daily. Swallow whole. 90 tablet 3   VITAMIN D PO Take 5,000 Units by mouth daily.     No current facility-administered medications for this visit.    Allergies  Allergen Reactions   No Known Allergies      Social History   Socioeconomic History   Marital status: Divorced    Spouse name: Not on file   Number of children: Not on file   Years of education: Not on file   Highest education level: Not on file  Occupational History   Not on file  Tobacco Use   Smoking status: Never   Smokeless tobacco: Never  Vaping Use   Vaping Use: Never used  Substance and Sexual Activity   Alcohol use: Yes    Comment: Rare   Drug use: No   Sexual activity: Not on file  Other Topics Concern   Not on file  Social History Narrative   Not on file   Social Determinants of Health   Financial Resource Strain: Not on file  Food Insecurity: Not on file  Transportation Needs: Not on file  Physical Activity: Not on file  Stress: Not on file  Social Connections: Not on file  Intimate Partner Violence: Not on file    Family History  Problem Relation Age of Onset   Dementia Mother    Lung cancer Father    Hyperparathyroidism Neg Hx     Review of Systems:  As stated in the HPI and otherwise negative.   BP (!) 142/98   Pulse 68   Ht 5\' 10"  (1.778 m)   Wt 71.7 kg  SpO2 98%   BMI 22.67 kg/m   Physical Examination: General: Well developed, well nourished, NAD  HEENT: OP clear, mucus membranes moist  SKIN: warm, dry. No rashes. Neuro: No focal deficits  Musculoskeletal: Muscle strength 5/5 all ext  Psychiatric: Mood and affect normal  Neck: No JVD, no carotid bruits, no thyromegaly, no lymphadenopathy.  Lungs:Clear bilaterally, no wheezes, rhonci, crackles Cardiovascular: Regular rate and rhythm. No murmurs, gallops or rubs. Abdomen:Soft. Bowel sounds present. Non-tender.  Extremities: No lower extremity edema. Pulses are 2 + in the bilateral DP/PT.  EKG:  EKG is ordered today. The ekg ordered today demonstrates NSR, rate 68 bpm  Echo 04/22/20:  1. Left ventricular ejection fraction, by estimation, is 55 to 60%. The  left ventricle has normal function. The left ventricle has no  regional  wall motion abnormalities. There is mild left ventricular hypertrophy.  Left ventricular diastolic parameters  were normal.   2. Right ventricular systolic function is normal. The right ventricular  size is normal. Tricuspid regurgitation signal is inadequate for assessing  PA pressure.   3. The mitral valve is normal in structure. Trivial mitral valve  regurgitation.   4. The aortic valve is tricuspid. Aortic valve regurgitation is not  visualized. No aortic stenosis is present.   5. The inferior vena cava is normal in size with greater than 50%  respiratory variability, suggesting right atrial pressure of 3 mmHg.   Recent Labs: No results found for requested labs within last 365 days.   Lipid Panel    Component Value Date/Time   CHOL 155 04/23/2020 0145   TRIG 200 (H) 04/23/2020 0145   HDL 34 (L) 04/23/2020 0145   CHOLHDL 4.6 04/23/2020 0145   VLDL 40 04/23/2020 0145   LDLCALC 81 04/23/2020 0145    Wt Readings from Last 3 Encounters:  09/06/22 71.7 kg  07/08/21 71.7 kg  06/14/21 72.9 kg    Assessment and Plan:   1. CAD without angina: Mild CAD by cath in 2019. No chest pain suggestive of angina. Continue ASA. He does not wish to take a statin.  We reviewed the guidelines for statin therapy and CAD again today.   Labs/ tests ordered today include:   Orders Placed This Encounter  Procedures   EKG 12-Lead   Disposition:   F/U with me in 12 months.   Signed, [name], MD 09/06/2022 12:09 PM    [facility] [address] [place], [place], [place]  [address] Phone: [phone]; Fax: [phone]

## 2023-04-19 DIAGNOSIS — H2513 Age-related nuclear cataract, bilateral: Secondary | ICD-10-CM | POA: Diagnosis not present

## 2024-05-05 ENCOUNTER — Ambulatory Visit: Admitting: Cardiovascular Disease

## 2024-05-12 ENCOUNTER — Encounter: Payer: Self-pay | Admitting: Cardiovascular Disease

## 2024-05-12 ENCOUNTER — Ambulatory Visit: Admitting: Cardiovascular Disease

## 2024-05-12 VITALS — BP 144/82 | HR 66 | Ht 70.0 in | Wt 154.4 lb

## 2024-05-12 DIAGNOSIS — I493 Ventricular premature depolarization: Secondary | ICD-10-CM | POA: Diagnosis not present

## 2024-05-12 DIAGNOSIS — Z136 Encounter for screening for cardiovascular disorders: Secondary | ICD-10-CM | POA: Diagnosis not present

## 2024-05-12 DIAGNOSIS — I2510 Atherosclerotic heart disease of native coronary artery without angina pectoris: Secondary | ICD-10-CM | POA: Diagnosis not present

## 2024-05-12 NOTE — Patient Instructions (Signed)
 Medication Instructions:  The current medical regimen is effective;  continue present plan and medications.  *If you need a refill on your cardiac medications before your next appointment, please call your pharmacy*  Testing/Procedures: You have been referred to have a vascuscreen of your carotids, abdominal aorta and legs.  Follow-Up: At [facility], you and your health needs are our priority.  As part of our continuing mission to provide you with exceptional heart care, our providers are all part of one team.  This team includes your primary Cardiologist (physician) and Advanced Practice Providers or APPs (Physician Assistants and Nurse Practitioners) who all work together to provide you with the care you need, when you need it.  Your next appointment:   1 year(s)  Provider:   Lonni Cash, MD    We recommend signing up for the patient portal called MyChart.  Sign up information is provided on this After Visit Summary.  MyChart is used to connect with patients for Virtual Visits (Telemedicine).  Patients are able to view lab/test results, encounter notes, upcoming appointments, etc.  Non-urgent messages can be sent to your provider as well.   To learn more about what you can do with MyChart, go to [email or web address].

## 2024-05-12 NOTE — Progress Notes (Signed)
 "  Chief Complaint  Patient presents with   Follow-up    CAD   History of Present Illness: 74 yo male with history of mild CAD, PVCs, hyperparathyroidism, kidney stones and syncopal event felt to be vasovagal who is here today for cardiac follow up. I met him in 2022. He was admitted to [facility] January 2022 after a syncopal event. He was Covid positive. He felt weak and dizzy and his heart rate was in the 40s. He passed out twice. EMS called and found him in a junctional rhythm. He was seen in the hospital by our EP team and in follow up by Dr. Cindie. His event was felt to be vasovagal. Echo 04/22/20 with LVEF=55-60%. No significant valve disease. 30 day cardiac monitor February 2022 with sinus, rare PACs and rare PVCs. No plans for further EP workup. Cardiac cath in February 2019 with mild ostial left main and mild mid LAD stenosis. He has not wished to take a statin.    He is here today for follow up. The patient denies any chest pain, dyspnea, palpitations, lower extremity edema, orthopnea, PND, dizziness, near syncope or syncope.   Primary Care Physician: Kip Righter, MD  Past Medical History:  Diagnosis Date   CAD (coronary artery disease)    Hypercalcemia    Kidney stones    Parathyroid  abnormality    Vasovagal syncope     Past Surgical History:  Procedure Laterality Date   LEFT HEART CATH AND CORONARY ANGIOGRAPHY N/A 05/24/2017   Procedure: LEFT HEART CATH AND CORONARY ANGIOGRAPHY;  Surgeon: Jordan, Peter M, MD;  Location: [facility] INVASIVE CV LAB;  Service: Cardiovascular;  Laterality: N/A;    Current Outpatient Medications  Medication Sig Dispense Refill   aspirin  EC 81 MG tablet Take 1 tablet (81 mg total) by mouth daily. Swallow whole. 90 tablet 3   VITAMIN D  PO Take 5,000 Units by mouth daily. (Patient taking differently: Take 5,000 Units by mouth daily. Per patient taking 2,000 units)     No current facility-administered medications for this visit.    Allergies  Allergen  Reactions   No Known Allergies     Social History   Socioeconomic History   Marital status: Divorced    Spouse name: Not on file   Number of children: Not on file   Years of education: Not on file   Highest education level: Not on file  Occupational History   Not on file  Tobacco Use   Smoking status: Never   Smokeless tobacco: Never  Vaping Use   Vaping status: Never Used  Substance and Sexual Activity   Alcohol use: Yes    Comment: Rare   Drug use: No   Sexual activity: Not on file  Other Topics Concern   Not on file  Social History Narrative   Not on file   Social Drivers of Health   Tobacco Use: Low Risk (05/12/2024)   Patient History    Smoking Tobacco Use: Never    Smokeless Tobacco Use: Never    Passive Exposure: Not on file  Financial Resource Strain: Not on file  Food Insecurity: Not on file  Transportation Needs: Not on file  Physical Activity: Not on file  Stress: Not on file  Social Connections: Not on file  Intimate Partner Violence: Not on file  Depression (EYV7-0): Not on file  Alcohol Screen: Not on file  Housing: Not on file  Utilities: Not on file  Health Literacy: Not on file  Family History  Problem Relation Age of Onset   Dementia Mother    Lung cancer Father    Hyperparathyroidism Neg Hx     Review of Systems:  As stated in the HPI and otherwise negative.   BP (!) 144/82 (BP Location: Right Arm, Patient Position: Sitting, Cuff Size: Normal)   Pulse 66   Ht 5' 10 (1.778 m)   Wt 154 lb 6.4 oz (70 kg)   SpO2 98%   BMI 22.15 kg/m   Physical Examination: General: Well developed, well nourished, NAD  SKIN: warm, dry. Neuro: No focal deficits  Psychiatric: Mood and affect normal  Neck: No JVD Lungs:Clear bilaterally, no wheezes, rhonci, crackles Cardiovascular: Regular rate and rhythm. No murmurs, gallops or rubs. Abdomen:Soft.  Extremities: No lower extremity edema.    EKG:  EKG is ordered today. The ekg ordered today  demonstrates  EKG Interpretation Date/Time:  Monday May 12 2024 11:12:49 EST Ventricular Rate:  66 PR Interval:  174 QRS Duration:  94 QT Interval:  376 QTC Calculation: 394 R Axis:   86  Text Interpretation: Normal sinus rhythm Confirmed by Verlin Bruckner [phone]) on 05/12/2024 11:13:59 AM   Echo 04/22/20:  1. Left ventricular ejection fraction, by estimation, is 55 to 60%. The  left ventricle has normal function. The left ventricle has no regional  wall motion abnormalities. There is mild left ventricular hypertrophy.  Left ventricular diastolic parameters  were normal.   2. Right ventricular systolic function is normal. The right ventricular  size is normal. Tricuspid regurgitation signal is inadequate for assessing  PA pressure.   3. The mitral valve is normal in structure. Trivial mitral valve  regurgitation.   4. The aortic valve is tricuspid. Aortic valve regurgitation is not  visualized. No aortic stenosis is present.   5. The inferior vena cava is normal in size with greater than 50%  respiratory variability, suggesting right atrial pressure of 3 mmHg.   Recent Labs: No results found for requested labs within last 365 days.   Lipid Panel    Component Value Date/Time   CHOL 155 04/23/2020 0145   TRIG 200 (H) 04/23/2020 0145   HDL 34 (L) 04/23/2020 0145   CHOLHDL 4.6 04/23/2020 0145   VLDL 40 04/23/2020 0145   LDLCALC 81 04/23/2020 0145    Wt Readings from Last 3 Encounters:  05/12/24 154 lb 6.4 oz (70 kg)  09/06/22 158 lb (71.7 kg)  07/08/21 158 lb (71.7 kg)    Assessment and Plan:   1. CAD without angina: Mild CAD by cath in 2019. No chest pain. He does not wish to take a statin.  We reviewed the guidelines for statin therapy and CAD again today.  -Continue ASA  2. PVCs: No recent palpitations  Will arrange vascular screening study (carotids/AAA/ABI) at pt request. Out of pocket pay  Labs/ tests ordered today include:  Orders Placed This  Encounter  Procedures   EKG 12-Lead   VAS US  VASCUSCREEN   Disposition:   F/U with me in 12 months.   Signed, Bruckner Verlin, MD 05/12/2024 11:58 AM    [facility] [address] [place], [place], [place]  [address] Phone: [phone]; Fax: [phone]   "

## 2024-06-12 ENCOUNTER — Ambulatory Visit (HOSPITAL_COMMUNITY)

## 2024-08-01 ENCOUNTER — Ambulatory Visit: Admitting: Cardiovascular Disease
